# Patient Record
Sex: Male | Born: 1975 | Race: Asian | Hispanic: No | Marital: Single | Smoking: Never smoker
Health system: Southern US, Community
[De-identification: ages and names within clinical notes are randomized; demographics above are authoritative.]

## PROBLEM LIST (undated history)

## (undated) DIAGNOSIS — B181 Chronic viral hepatitis B without delta-agent: Secondary | ICD-10-CM

## (undated) HISTORY — PX: LIVER BIOPSY: SHX301

## (undated) HISTORY — PX: TONSILLECTOMY AND ADENOIDECTOMY: SUR1326

## (undated) HISTORY — DX: Chronic viral hepatitis B without delta-agent: B18.1

---

## 2011-04-11 DIAGNOSIS — J039 Acute tonsillitis, unspecified: Secondary | ICD-10-CM | POA: Insufficient documentation

## 2011-04-11 DIAGNOSIS — K219 Gastro-esophageal reflux disease without esophagitis: Secondary | ICD-10-CM | POA: Insufficient documentation

## 2011-04-18 ENCOUNTER — Encounter: Payer: Self-pay | Admitting: Internal Medicine

## 2011-05-03 ENCOUNTER — Ambulatory Visit: Payer: Self-pay | Admitting: Internal Medicine

## 2011-05-03 ENCOUNTER — Other Ambulatory Visit (INDEPENDENT_AMBULATORY_CARE_PROVIDER_SITE_OTHER): Payer: BC Managed Care – PPO

## 2011-05-03 ENCOUNTER — Ambulatory Visit (INDEPENDENT_AMBULATORY_CARE_PROVIDER_SITE_OTHER): Payer: BC Managed Care – PPO | Admitting: Internal Medicine

## 2011-05-03 ENCOUNTER — Encounter: Payer: Self-pay | Admitting: Internal Medicine

## 2011-05-03 VITALS — BP 102/58 | HR 66 | Ht 67.0 in | Wt 172.0 lb

## 2011-05-03 DIAGNOSIS — B181 Chronic viral hepatitis B without delta-agent: Secondary | ICD-10-CM

## 2011-05-03 DIAGNOSIS — K219 Gastro-esophageal reflux disease without esophagitis: Secondary | ICD-10-CM

## 2011-05-03 LAB — COMPREHENSIVE METABOLIC PANEL
Albumin: 4.3 g/dL (ref 3.5–5.2)
Alkaline Phosphatase: 60 U/L (ref 39–117)
Calcium: 9.2 mg/dL (ref 8.4–10.5)
Chloride: 101 mEq/L (ref 96–112)
Glucose, Bld: 91 mg/dL (ref 70–99)
Potassium: 4.2 mEq/L (ref 3.5–5.1)
Sodium: 138 mEq/L (ref 135–145)
Total Protein: 7.2 g/dL (ref 6.0–8.3)

## 2011-05-03 LAB — CBC WITH DIFFERENTIAL/PLATELET
Eosinophils Relative: 1.7 % (ref 0.0–5.0)
Lymphocytes Relative: 31.2 % (ref 12.0–46.0)
MCV: 84.5 fl (ref 78.0–100.0)
Monocytes Absolute: 0.3 10*3/uL (ref 0.1–1.0)
Monocytes Relative: 7.3 % (ref 3.0–12.0)
Neutrophils Relative %: 59.4 % (ref 43.0–77.0)
Platelets: 185 10*3/uL (ref 150.0–400.0)
WBC: 4.7 10*3/uL (ref 4.5–10.5)

## 2011-05-03 MED ORDER — RANITIDINE HCL 150 MG PO TABS: 150.0000 mg | ORAL_TABLET | Freq: Two times a day (BID) | ORAL | Status: DC

## 2011-05-03 MED ORDER — TENOFOVIR DISOPROXIL FUMARATE 300 MG PO TABS: 300.0000 mg | ORAL_TABLET | Freq: Every day | ORAL | Status: DC

## 2011-05-03 NOTE — Patient Instructions (Addendum)
Your prescriptions are being printed for you to take to your pharmacy ([facility], Zantac). Please call us back with the information of your physician in [place] so we can get the records.  Please go to the basement upon leaving today to have your labs done. You have been scheduled for a Liver ultrasound at [facility] on 05/09/11 @ 8:30 am. Please arrive at the Radiology Department on the 1st floor at 8:15 am. Please have nothing to eat or drink after midnight the night before.

## 2011-05-03 NOTE — Progress Notes (Signed)
  Subjective:    Patient ID: Parker Curry, male    DOB: May 14, 1975, 36 y.o.   MRN: [id number]  HPI this man is here accompanied by his friend and sponsor, he is a Engineer, site. Previously lived in [place] and then a brief time in [place]. He has complaints of heartburn and indigestion and also needs followup and treatment of chronic hepatitis B. It sounds like he developed fatigue and feelings of being unwell about 4 or 5 years ago. 2 years ago he had a liver biopsy and was started on tenofovir. He went back to [place] every 6 months for evaluation with ultrasound and labs and had his medications refilled. He wants to establish care here in the [place] as he will be living long-term in [place] according to the interpreter and friend. His friend and sponsor is a little Stage manager at [employer]. The patient speaks some [place]. His care was in "back, so apparently his records aren't [place] though he does not have those he is given this the name of his hepatologist there we're pursuing records. Dr. Pablo Ledger.  The patient indicates no swelling, no significant liver problems noted in the past other than that we know he has chronic hepatitis B, presumably maternal fetal transmission. Does have fatigue.  He is also complaining of fairly frequent heartburn, with regurgitation but no dysphagia. He does not use caffeine or smoke. He belches frequently when he chance in that is disturbing him. He has used some intermittent ranitidine, one 50 mg, after the fact with some benefit if not complete benefit it sounds like. He's had no dysphagia or weight loss or bleeding.  No Known Allergies No outpatient prescriptions prior to visit.   Past Medical History  Diagnosis Date  . Chronic hepatitis B    Past Surgical History  Procedure Date  . Liver biopsy    History   Social History  . Marital Status: Single    Spouse Name: N/A    Number of Children: 0  . Years of Education: N/A    Occupational History  . [employer]    Social History Main Topics  . Smoking status: Never Smoker   . Smokeless tobacco: Never Used  . Alcohol Use: No  . Drug Use: No  . Sexually Active: None   Other Topics Concern  . None   Social History Narrative  . None   Family History  Problem Relation Age of Onset  . Colon cancer Neg Hx           Review of Systems He's had some sore throat and hoarseness All other systems negative or as above    Objective:   Physical Exam General:  Well-developed, well-nourished Asian man and in no acute distress Eyes:  anicteric. ENT:   Mouth and posterior pharynx free of lesions.  Neck:   supple w/o thyromegaly or mass.  Lungs: Clear to auscultation bilaterally. Heart:  S1S2, no rubs, murmurs, gallops. Abdomen:  soft, non-tender, no hepatosplenomegaly, hernia, or mass and BS+.  Lymph:  no cervical or supraclavicular adenopathy. Extremities:   no edema Skin   no rash. No stigmata of chronic liver disease  Psych:  appropriate mood and  Affect.           Assessment & Plan:

## 2011-05-03 NOTE — Assessment & Plan Note (Signed)
Frequent heartburn and belching, consistent with GERD. No warning signs or symptoms. Will start twice a day ranitidine. 150 mg. Did not see any obvious reason for endoscopy at this point. If he has cirrhosis then could need an upper endoscopy but that has not apparent from the clinical exam and information so far. Await records review.

## 2011-05-03 NOTE — Assessment & Plan Note (Addendum)
I will refill his tenofovir for the time being. We'll check labs to include CBC, metabolic panel that is comprehensive, hepatitis B DNA, and get an alpha-fetoprotein. He will also have an ultrasound of the liver. We need to get his records which are trying to do. And I will search for a hepatologist to try to follow him as I think that will be best. I have explained that is is not a typical part of my practice. The other approach would be to see me and a hepatologist at varying intervals. His friend would prefer he be evaluated and followed at [facility] since she works there.  Fibroscan July 2010 showed minimal fibrosis. Hepatitis B DNA [id number] international units per milliliter October 2011 LFTs have been normal line hepatitis B surface antigen positive, hepatitis B E. antigen positive, hepatitis B E. antibody negative Ultrasound August 2010, unable to interpret at this time, report in [place].

## 2011-05-04 LAB — AFP TUMOR MARKER: AFP-Tumor Marker: 4.5 ng/mL (ref 0.0–8.0)

## 2011-05-06 ENCOUNTER — Telehealth: Payer: Self-pay

## 2011-05-06 NOTE — Telephone Encounter (Signed)
Patient advised of the appt with Dr Wyvonnia Lora at [facility] [phone].  Notes faxed to [place].  The patient is advised of the appt on 06/05/11 10:30.

## 2011-05-06 NOTE — Telephone Encounter (Signed)
Message copied by Annett Fabian on Mon May 06, 2011  4:08 PM ------      Message from: Stan Head E      Created: Mon May 06, 2011  1:26 PM      Regarding: RE: liver patients at [facility]       If they treat chronic hepatitis B that is fine - I think she is an ID doc, however            We have an [place] and quantitative Hep B level pending      ----- Message -----         From: Rossie Muskrat, RN,CGRN         Sent: 05/06/2011  11:13 AM           To: Iva Boop, MD      Subject: RE: liver patients at [facility]                            They do have a hepatologist that treats hep B  Dr Drema Halon.  Do you want me to set this up?      ----- Message -----         From: Iva Boop, MD         Sent: 05/03/2011   1:03 PM           To: Rossie Muskrat, RN,CGRN      Subject: liver patients at [facility]                                This man has Chronic hepatitis B.      Trying to get records from [place].      His friend works at [employer] and wanted him to see hepatologist there if possible. I told her they may not have one. I think they get some help from hepatology docs at [facility] - drive up from [place].            Can you see if they have anyone that does chronic Hepatitis B care and let me know.

## 2011-05-07 ENCOUNTER — Ambulatory Visit: Payer: Self-pay | Admitting: Internal Medicine

## 2011-05-09 ENCOUNTER — Ambulatory Visit (HOSPITAL_COMMUNITY)
Admission: RE | Admit: 2011-05-09 | Discharge: 2011-05-09 | Disposition: A | Discharge status: Home / Self Care | Payer: BC Managed Care – PPO | Source: Ambulatory Visit | Attending: Internal Medicine | Admitting: Internal Medicine

## 2011-05-09 DIAGNOSIS — B181 Chronic viral hepatitis B without delta-agent: Secondary | ICD-10-CM | POA: Insufficient documentation

## 2011-05-10 NOTE — Progress Notes (Signed)
Quick Note:  This shows that the hepatitis B virus is being suppressed by his medication. It will be important for him to keep his appointment at [facility]. Please communicate this to him, the ultrasound looked okay as well. We need to forward these labs and the ultrasound report to the physician at [facility] he is seeing about his hepatitis B. This could take a letter for unable to reach them by phone. ______

## 2011-06-06 ENCOUNTER — Telehealth: Payer: Self-pay | Admitting: Internal Medicine

## 2011-06-06 NOTE — Telephone Encounter (Signed)
Patient wants to schedule a colonoscopy. A friend has called for him and he is not with her to get the exact symptoms.  I have scheduled an office visit for him to discuss in person with Dr Leone Payor on 06/14/11

## 2011-06-06 NOTE — Telephone Encounter (Signed)
Left message for patient to call back  

## 2011-06-14 ENCOUNTER — Encounter: Payer: Self-pay | Admitting: Internal Medicine

## 2011-06-14 ENCOUNTER — Ambulatory Visit (INDEPENDENT_AMBULATORY_CARE_PROVIDER_SITE_OTHER): Payer: BC Managed Care – PPO | Admitting: Internal Medicine

## 2011-06-14 VITALS — BP 90/58 | HR 76 | Ht 67.0 in | Wt 167.2 lb

## 2011-06-14 DIAGNOSIS — R1013 Epigastric pain: Secondary | ICD-10-CM

## 2011-06-14 DIAGNOSIS — R1319 Other dysphagia: Secondary | ICD-10-CM

## 2011-06-14 NOTE — Patient Instructions (Signed)
You have been scheduled for an endoscopy. Please follow written instructions given to you at your visit today.  

## 2011-06-14 NOTE — Progress Notes (Signed)
  Subjective:    Patient ID: Parker Curry, male    DOB: July 04, 1975, 36 y.o.   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 I had seen him last month to establish care for hepatitis B. He is waiting to see an infectious disease specialist at [facility], and has appointment in May. That seems stable overall.  He was complaining of what sounded like heartburn and reflux problems when here the last time. Ranitidine was used, he does not think that's helping. He is also describing food sticking in the suprasternal notch and what seems to be dysphagia. He also has postprandial epigastric pain.  Medications, allergies, past medical history, past surgical history, family history and social history are reviewed and updated in the EMR.   Review of Systems As above    Objective:   Physical Exam General:  NAD Eyes:   anicteric Lungs:  clear Heart:  S1S2 no rubs, murmurs or gallops Abdomen:  soft and nontender, BS+ Ext:   no edema        Assessment & Plan:   1. Dysphagia   2. Epigastric pain    It now sounds like he is having dysphagia problems. There is a bit of a language barrier but between talking to him and clarifying with the interpreter, it does sound like he is having dysphagia. Thus, upper endoscopy is warranted. Possible esophageal dilation. I have explained the risks benefits and indications to the patient and he understands and agrees to proceed. Will wait until this is complete prior to making any medication changes.

## 2011-06-17 ENCOUNTER — Encounter: Payer: Self-pay | Admitting: Internal Medicine

## 2011-06-17 ENCOUNTER — Ambulatory Visit (AMBULATORY_SURGERY_CENTER): Payer: BC Managed Care – PPO | Admitting: Internal Medicine

## 2011-06-17 VITALS — BP 105/77 | HR 76 | Temp 97.3°F | Resp 16 | Ht 67.0 in | Wt 167.0 lb

## 2011-06-17 DIAGNOSIS — R1013 Epigastric pain: Secondary | ICD-10-CM

## 2011-06-17 DIAGNOSIS — R1319 Other dysphagia: Secondary | ICD-10-CM

## 2011-06-17 HISTORY — PX: UPPER GASTROINTESTINAL ENDOSCOPY: SHX188

## 2011-06-17 MED ORDER — PANTOPRAZOLE SODIUM 40 MG PO TBEC: 40.0000 mg | DELAYED_RELEASE_TABLET | Freq: Every day | ORAL | Status: DC

## 2011-06-17 MED ORDER — SODIUM CHLORIDE 0.9 % IV SOLN: 500.0000 mL | INTRAVENOUS | Status: DC

## 2011-06-17 NOTE — Progress Notes (Signed)
Interpretor in with pt, Parker Curry,  And he went over the instructions with the pt.  Also the pt care partner was [place] but also spoke vietnanise.  No complaints noted in the recovery room.  Patient did not experience any of the following events: a burn prior to discharge; a fall within the facility; wrong site/side/patient/procedure/implant event; or a hospital transfer or hospital admission upon discharge from the facility. [phone]) Patient did not have preoperative order for IV antibiotic SSI prophylaxis. [phone])

## 2011-06-17 NOTE — Patient Instructions (Addendum)
The endoscopy exam looked normal. The esophagus was dilated given your swallowing problems. Stop ranitidine and start pantoprazole 40 mg daily. This blocks acid and hopefully will help with your abdominal pain and reflux problems.  A prescription was sent to your pharmacy.  Follow the dilatation diet the rest of the day.  Resume your prior diet Tuesday am.   YOU HAD AN ENDOSCOPIC PROCEDURE TODAY AT THE [facility]: Refer to the procedure report that was given to you for any specific questions about what was found during the examination.  If the procedure report does not answer your questions, please call your gastroenterologist to clarify.  If you requested that your care partner not be given the details of your procedure findings, then the procedure report has been included in a sealed envelope for you to review at your convenience later.  YOU SHOULD EXPECT: Some feelings of bloating in the abdomen. Passage of more gas than usual.  Walking can help get rid of the air that was put into your GI tract during the procedure and reduce the bloating. If you had a lower endoscopy (such as a colonoscopy or flexible sigmoidoscopy) you may notice spotting of blood in your stool or on the toilet paper. If you underwent a bowel prep for your procedure, then you may not have a normal bowel movement for a few days.  DIET: Your first meal following the procedure should be a light meal and then it is ok to progress to your normal diet.  A half-sandwich or bowl of soup is an example of a good first meal.  Heavy or fried foods are harder to digest and may make you feel nauseous or bloated.  Likewise meals heavy in dairy and vegetables can cause extra gas to form and this can also increase the bloating.  Drink plenty of fluids but you should avoid alcoholic beverages for 24 hours.  ACTIVITY: Your care partner should take you home directly after the procedure.  You should plan to take it easy, moving slowly for  the rest of the day.  You can resume normal activity the day after the procedure however you should NOT DRIVE or use heavy machinery for 24 hours (because of the sedation medicines used during the test).    SYMPTOMS TO REPORT IMMEDIATELY: A gastroenterologist can be reached at any hour.  During normal business hours, 8:30 AM to 5:00 PM Monday through Friday, call [phone].  After hours and on weekends, please call the GI answering service at [phone] who will take a message and have the physician on call contact you.     Following upper endoscopy (EGD)  Vomiting of blood or coffee ground material  New chest pain or pain under the shoulder blades  Painful or persistently difficult swallowing  New shortness of breath  Fever of 100F or higher  Black, tarry-looking stools  FOLLOW UP: If any biopsies were taken you will be contacted by phone or by letter within the next 1-3 weeks.  Call your gastroenterologist if you have not heard about the biopsies in 3 weeks.  Our staff will call the home number listed on your records the next business day following your procedure to check on you and address any questions or concerns that you may have at that time regarding the information given to you following your procedure. This is a courtesy call and so if there is no answer at the home number and we have not heard from you through  the emergency physician on call, we will assume that you have returned to your regular daily activities without incident.  SIGNATURES/CONFIDENTIALITY: You and/or your care partner have signed paperwork which will be entered into your electronic medical record.  These signatures attest to the fact that that the information above on your After Visit Summary has been reviewed and is understood.  Full responsibility of the confidentiality of this discharge information lies with you and/or your care-partner.

## 2011-06-17 NOTE — Op Note (Signed)
[facility] [address] [place], [place]  [address]  ENDOSCOPY PROCEDURE REPORT  PATIENT:  Parker, Curry  MR#:  [id number] BIRTHDATE:  02/28/1976, 35 yrs. old  GENDER:  male  ENDOSCOPIST:  Iva Boop, MD, [facility]  PROCEDURE DATE:  06/17/2011 PROCEDURE:  EGD, diagnostic, Elease Hashimoto Dilation of the Esophagus ASA CLASS:  Class I INDICATIONS:  1) dysphagia  MEDICATIONS:   These medications were titrated to patient response per physician's verbal order, Fentanyl 50 mcg IV, Versed 5 mg IV TOPICAL ANESTHETIC:  Cetacaine Spray  DESCRIPTION OF PROCEDURE:   After the risks benefits and alternatives of the procedure were thoroughly explained, informed consent was obtained.  The [id number] endoscope was introduced through the mouth and advanced to the second portion of the duodenum, without limitations.  The instrument was slowly withdrawn as the mucosa was carefully examined. <<PROCEDUREIMAGES>>  The upper, middle, and distal third of the esophagus were carefully inspected and no abnormalities were noted. The z-line was well seen at the GEJ. The endoscope was pushed into the fundus which was normal including a retroflexed view. The antrum, first and second part of the duodenum were unremarkable.    Dilation was then performed at the total esophagus  1) Dilator:  Elease Hashimoto  Size(s):  54 French Resistance:  minimal  Heme:  none  COMPLICATIONS:  None  ENDOSCOPIC IMPRESSION: 1) Normal EGD - 54 Fr Maloney dilation performed due to dysphagia RECOMMENDATIONS: 1) post dilation diet today 2) stop ranitidine and start pantoprazole 40 mg daily (prescription sent electronically) 3) Follow-up with GI as needed and annually on routine basis  Iva Boop, MD, [facility]  CC:  The Patient  n. eSIGNED:   Iva Boop at 06/17/2011 03:10 PM  Eulis Foster, [phone]

## 2011-06-18 ENCOUNTER — Telehealth: Payer: Self-pay | Admitting: *Deleted

## 2011-06-18 NOTE — Telephone Encounter (Signed)
NO ANSWER, MESSAGE LEFT FOR PATIENT ON ANSWERING MACHINE. 

## 2011-11-08 ENCOUNTER — Telehealth: Payer: Self-pay | Admitting: Internal Medicine

## 2011-11-08 NOTE — Telephone Encounter (Signed)
Forward  4 pages from [facility] to Dr. Stan Head for review on 11-11-11 ym

## 2011-11-11 ENCOUNTER — Telehealth: Payer: Self-pay | Admitting: Internal Medicine

## 2011-11-11 NOTE — Telephone Encounter (Signed)
Received 4 pages from [facility]. Sent to Dr. Leone Payor. 11/11/11/SD

## 2012-02-25 ENCOUNTER — Encounter: Payer: Self-pay | Admitting: Cardiovascular Disease

## 2012-02-25 ENCOUNTER — Ambulatory Visit (INDEPENDENT_AMBULATORY_CARE_PROVIDER_SITE_OTHER): Payer: BC Managed Care – PPO | Admitting: Cardiovascular Disease

## 2012-02-25 VITALS — BP 106/78 | HR 82 | Ht 65.5 in | Wt 176.4 lb

## 2012-02-25 DIAGNOSIS — R0789 Other chest pain: Secondary | ICD-10-CM

## 2012-02-25 NOTE — Assessment & Plan Note (Signed)
Boaz presents today for further evaluation of his chest pain. The pain is described as a pressure like sensation. It typically occur if he has eaten some or feces exercising. The pain last about 20 minutes. It is described as a heaviness or pressure. He typically stops and rests and stretches out as just to get the pain to go away.  He does not eat a lot of greasy food. He has history of gastroesophageal reflux but has been on an acid blocker which is helped some. These pains do not appear to be the same type of pain

## 2012-02-25 NOTE — Patient Instructions (Addendum)
Interpreter needed, speaks viet[place].   Your physician has requested that you have a stress echocardiogram.  Please follow instruction sheet as given.  Your physician recommends that you schedule a follow-up appointment in: as needed basis

## 2012-02-25 NOTE — Progress Notes (Signed)
    Parker Curry Date of Birth  1976-02-15       [facility]    [place] [address]  [address] [place], [place]  [address]   [place], [place]  [address] [phone]     [phone]   Fax  [phone]    Fax [phone]  Problem List: 1. Chest pain   History of Present Illness:  Parker Curry is a 36 yo [place] gentleman who presents for evaluation of chest pain.  Pressure, heaviness sensation.  Lasts for 20 minutes. It is relieved with stretching.    He exercises on a regular basis.  He eats typical [place] food. He is a vegetarian.  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  A [place] interpreter from the  hospital was here to help with the office visit today.  Current Outpatient Prescriptions on File Prior to Visit  Medication Sig Dispense Refill  . pantoprazole (PROTONIX) 40 MG tablet Take 1 tablet (40 mg total) by mouth daily. 30 minutes before breakfast  30 tablet  11  . tenofovir (VIREAD) 300 MG tablet Take 1 tablet (300 mg total) by mouth daily.  30 tablet  1  . [DISCONTINUED] ranitidine (ZANTAC) 150 MG tablet Take 1 tablet (150 mg total) by mouth 2 (two) times daily.  60 tablet  1    No Known Allergies  Past Medical History  Diagnosis Date  . Chronic hepatitis B     Past Surgical History  Procedure Date  . Liver biopsy   . Tonsillectomy and adenoidectomy     History  Smoking status  . Never Smoker   Smokeless tobacco  . Never Used    History  Alcohol Use No    Family History  Problem Relation Age of Onset  . Colon cancer Neg Hx     Reviw of Systems:  Reviewed in the HPI.  All other systems are negative.  Physical Exam: Blood pressure 106/78, pulse 82, height 5' 5.5" (1.664 m), weight 176 lb 6.4 oz (80.015 kg). General: Well developed, well nourished Asian gentleman in  no acute distress.  Head: Normocephalic, atraumatic, sclera non-icteric, mucus membranes are moist,   Neck: Supple.  Carotids are 2 + without bruits. No JVD   Lungs: Clear   Heart: RR, normal S1, S2  Abdomen: Soft, non-tender, non-distended with normal bowel sounds.  Msk:  Strength and tone are normal   Extremities: No clubbing or cyanosis. No edema.  Distal pedal pulses are 2+ and equal    Neuro: CN II - XII intact.  Alert and oriented X 3.   Psych:  Normal   ECG: Dec. 3,  2013-normal sinus rhythm at 82 beats a minute. He has early repolarization changes.  Assessment / Plan:

## 2012-03-02 ENCOUNTER — Ambulatory Visit (HOSPITAL_COMMUNITY): Payer: BC Managed Care – PPO | Attending: Internal Medicine | Admitting: Radiology

## 2012-03-02 ENCOUNTER — Ambulatory Visit (HOSPITAL_COMMUNITY): Payer: BC Managed Care – PPO | Attending: Internal Medicine

## 2012-03-02 DIAGNOSIS — R0789 Other chest pain: Secondary | ICD-10-CM | POA: Insufficient documentation

## 2012-03-02 DIAGNOSIS — R0989 Other specified symptoms and signs involving the circulatory and respiratory systems: Secondary | ICD-10-CM

## 2012-03-02 DIAGNOSIS — R072 Precordial pain: Secondary | ICD-10-CM

## 2012-03-02 NOTE — Progress Notes (Signed)
Echocardiogram performed.  

## 2012-03-06 ENCOUNTER — Encounter: Payer: Self-pay | Admitting: *Deleted

## 2012-06-16 IMAGING — US US ABDOMEN COMPLETE
1 series · 14 of 25 positions shown · non-contrast
Comparison: None.

CLINICAL DATA: Hepatitis B screening.

COMPLETE ABDOMINAL ULTRASOUND

[Series 1: us abdomen complete · 0.32mm/px · 14 of 73 slices shown]
[im 1/73]
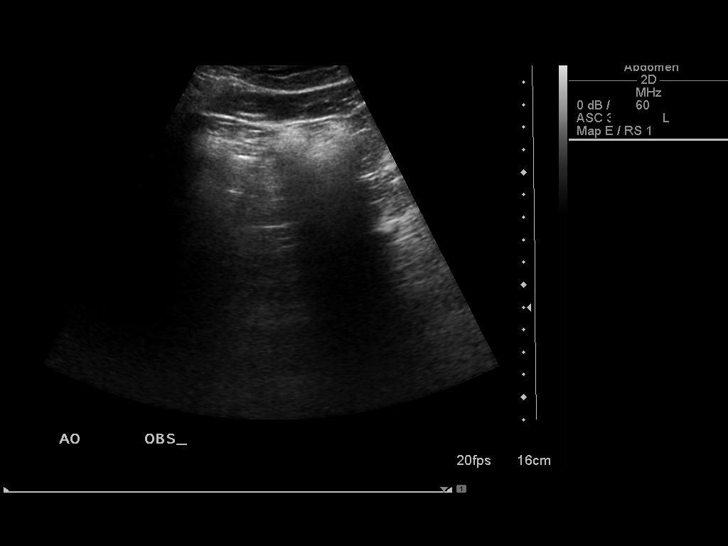
[im 7/73]
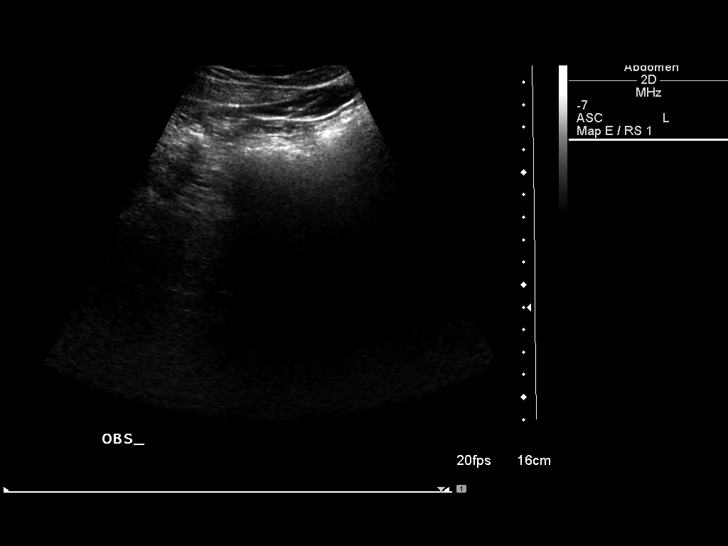
[im 13/73]
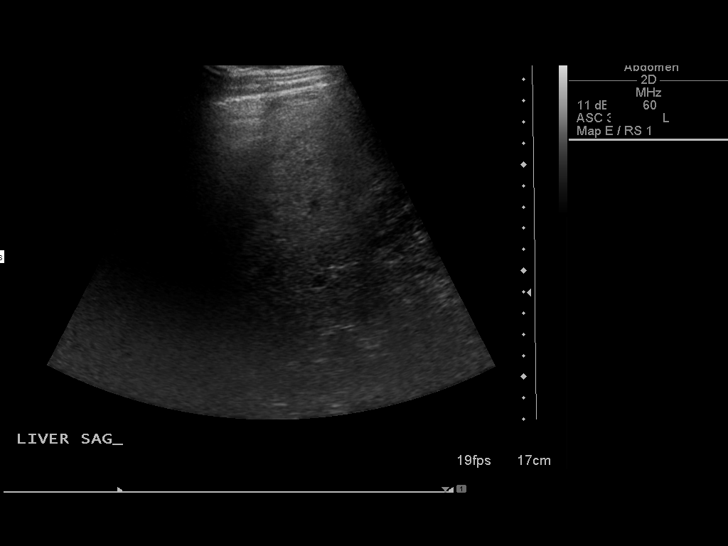
[im 19/73]
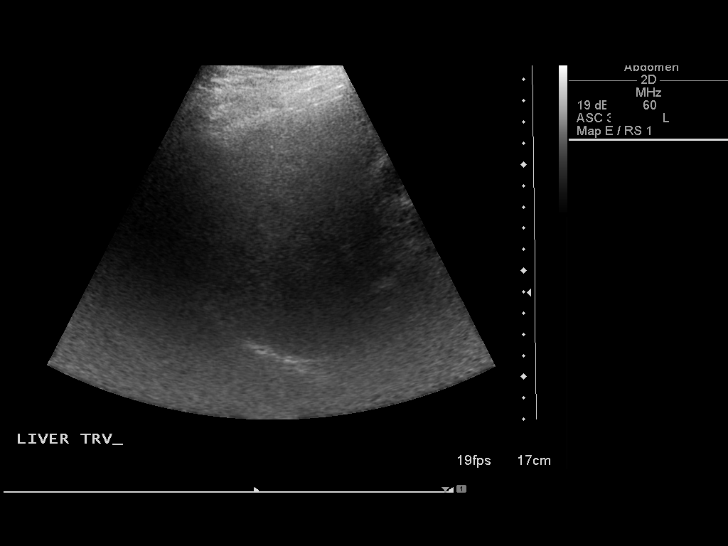
[im 25/73]
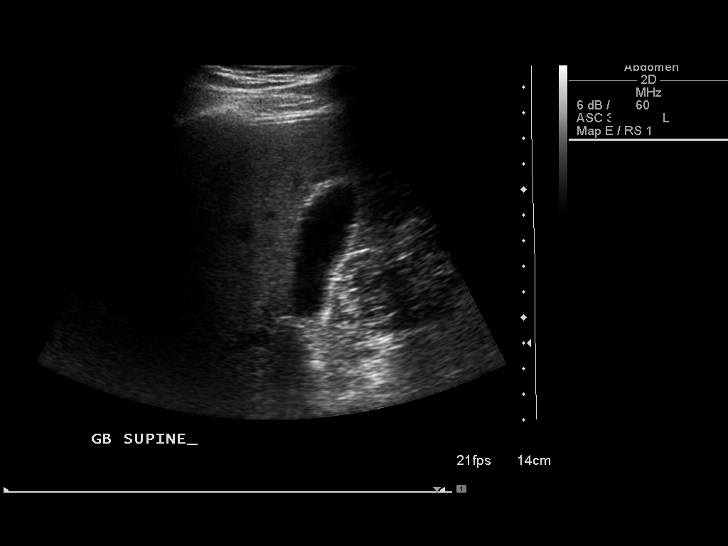
[im 28/73]
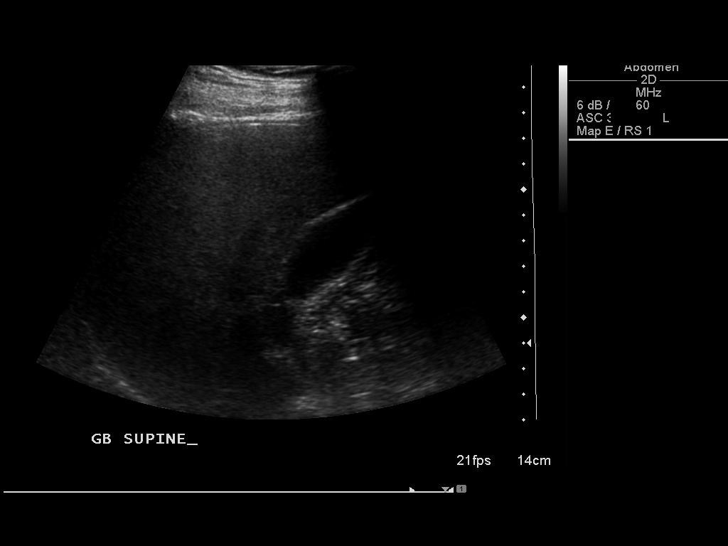
[im 34/73]
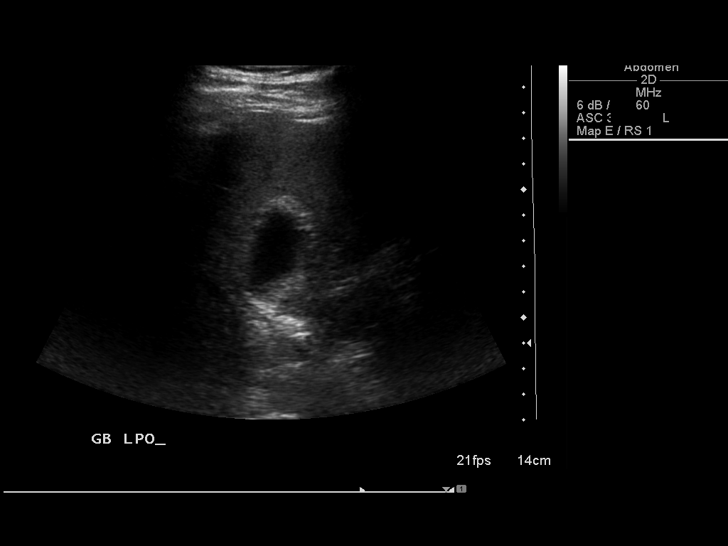
[im 40/73]
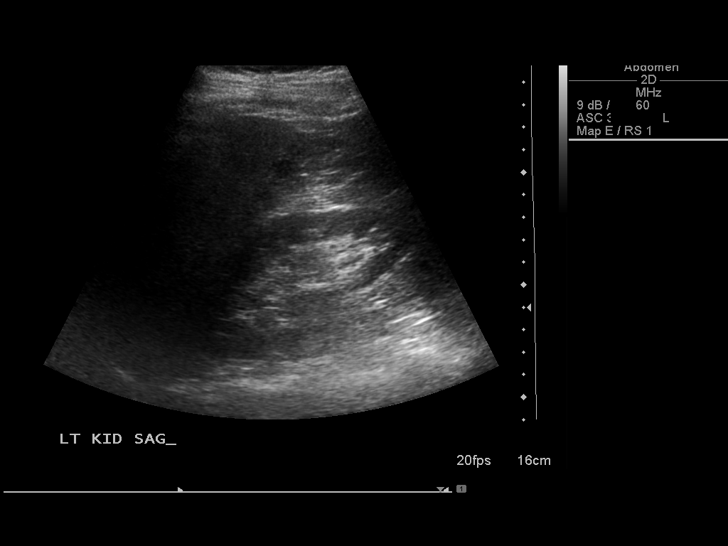
[im 46/73]
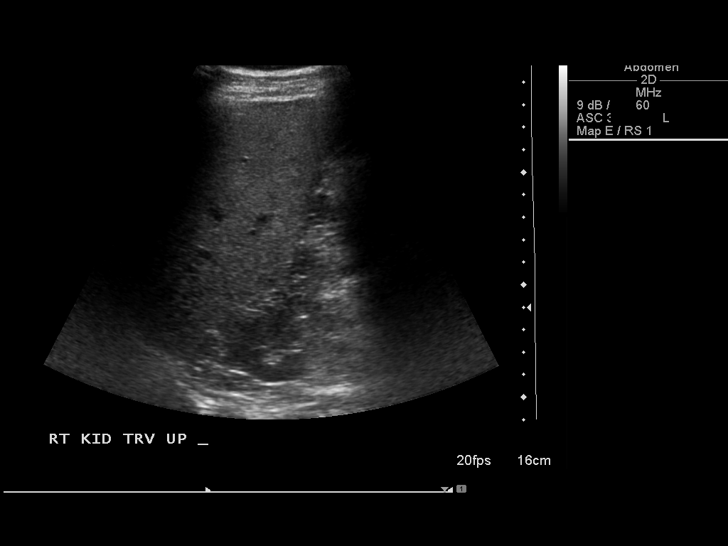
[im 49/73]
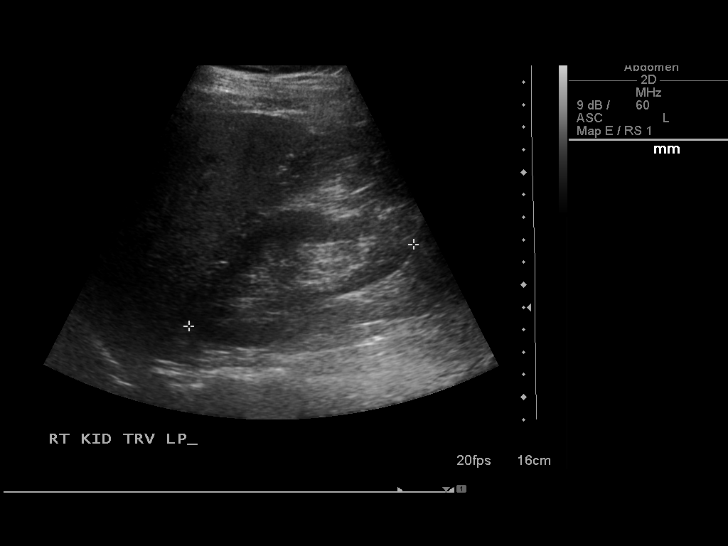
[im 55/73]
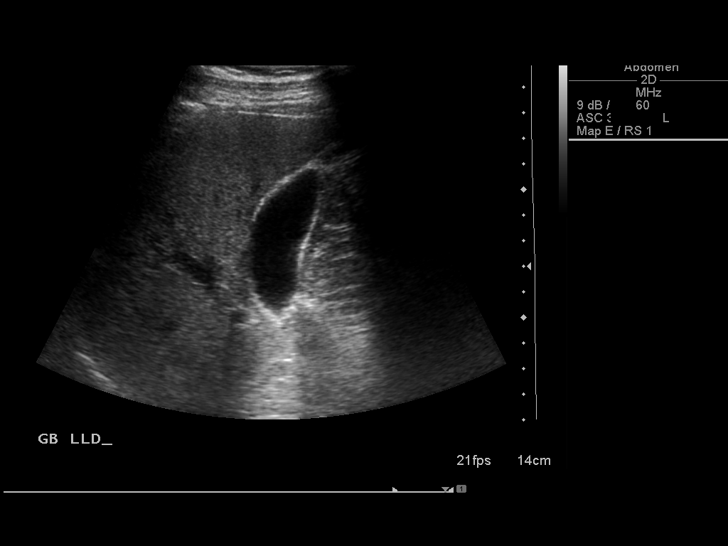
[im 61/73]
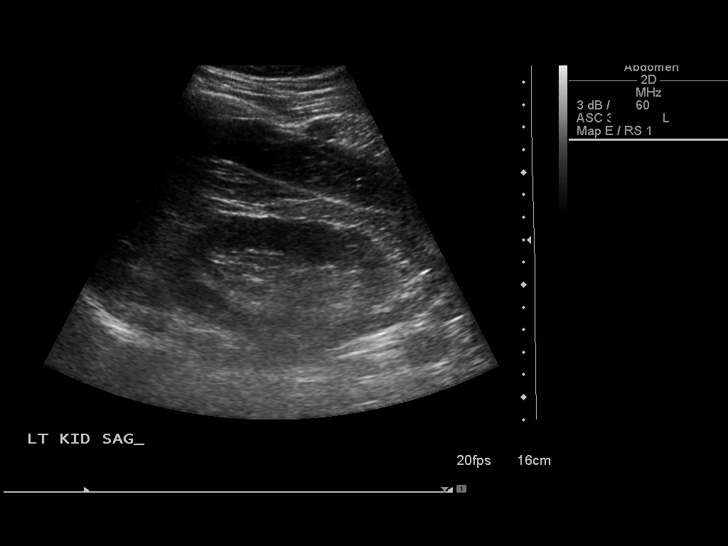
[im 67/73]
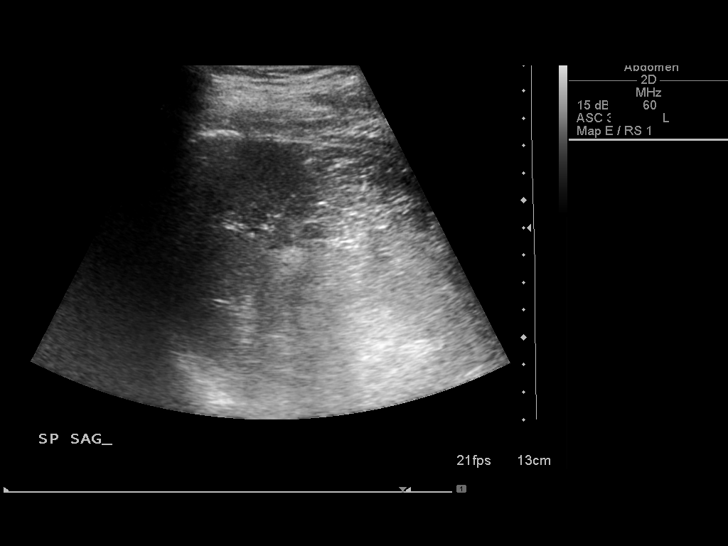
[im 73/73]
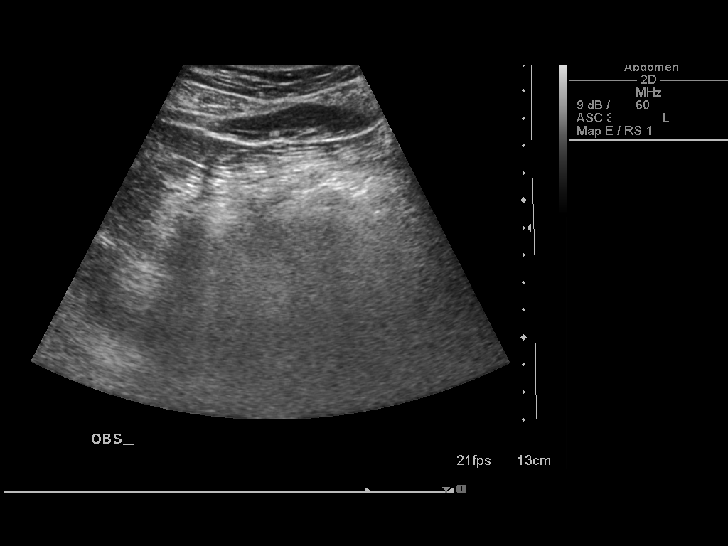

[14 of 25 positions shown; findings below may reference images not displayed]

FINDINGS: Gallbladder:  No gallstones, gallbladder wall thickening, or
pericholecystic fluid.

Common bile duct:  4.2 mm.

Liver:  Evaluation limited by bowel gas.  Visualized portions
reveal increased echogenicity without obvious mass

IVC:  Not visualized secondary to overlying bowel gas.

Pancreas:  Not visualized secondary to overlying bowel gas.

Spleen:  5.5 cm without focal mass.

Right Kidney:  11.4 cm. No hydronephrosis or renal mass..

Left Kidney:  11.5 cm. No hydronephrosis or renal mass..

Abdominal aorta:  Proximal aspect not visualized secondary to bowel
gas.  Otherwise maximal AP dimension 2 cm.
IMPRESSION: Examination limited by bowel gas.  No definitive liver mass
identified.  Please see above.

## 2013-04-09 ENCOUNTER — Ambulatory Visit: Payer: BC Managed Care – PPO | Admitting: Internal Medicine

## 2013-05-25 ENCOUNTER — Ambulatory Visit (INDEPENDENT_AMBULATORY_CARE_PROVIDER_SITE_OTHER): Payer: BC Managed Care – PPO | Admitting: Family Medicine

## 2013-05-25 VITALS — BP 96/74 | HR 80 | Temp 97.6°F | Resp 16 | Ht 65.5 in | Wt 169.0 lb

## 2013-05-25 DIAGNOSIS — K219 Gastro-esophageal reflux disease without esophagitis: Secondary | ICD-10-CM

## 2013-05-25 DIAGNOSIS — J069 Acute upper respiratory infection, unspecified: Secondary | ICD-10-CM

## 2013-05-25 MED ORDER — RANITIDINE HCL 150 MG PO TABS: 150.0000 mg | ORAL_TABLET | Freq: Two times a day (BID) | ORAL | Status: DC

## 2013-05-25 MED ORDER — AZITHROMYCIN 250 MG PO TABS: ORAL_TABLET | ORAL | Status: DC

## 2013-05-25 NOTE — Progress Notes (Signed)
This chart was scribed for Parker SidleKurt Lauenstein, MD  by Ashley JacobsBrittany Andrews, [facility] Scribe. The patient was seen in room and the patient's care was started at 11:32 AM.  Sore Throat   Fever  Associated symptoms include a sore throat.  HPI Comments:Parker Curry is a 38 y.o. male who arrives to the [facility] complaining of unresolved sore throat for the past three to four days. Pt has the associated symptom of heart burn and fever. 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 Pt is unmarried.   Past Medical History  Diagnosis Date   Chronic hepatitis B    Current Outpatient Prescriptions on File Prior to Visit  Medication Sig Dispense Refill   tenofovir (VIREAD) 300 MG tablet Take 1 tablet (300 mg total) by mouth daily.  30 tablet  1   No current facility-administered medications on file prior to visit.   No Known Allergies Family History  Problem Relation Age of Onset   Colon cancer Neg Hx    Review of Systems  Constitutional: Positive for fever.  HENT: Positive for sore throat.    Objective: Very pleasant individual in no acute distress, and moderately overweight HEENT: Mild erythema of the throat, no exudates Neck: Supple no adenopathy Chest: Clear Heart: Regular no murmur: Abdomen: Soft nontender Skin: Clear rashes or suspicious lesions Neurologically: Patient alert and cooperative, normal gait  Assessment: URI  Plan: Z-Pak

## 2013-06-10 ENCOUNTER — Encounter: Payer: Self-pay | Admitting: Internal Medicine

## 2013-06-10 ENCOUNTER — Ambulatory Visit (INDEPENDENT_AMBULATORY_CARE_PROVIDER_SITE_OTHER): Payer: BC Managed Care – PPO | Admitting: Internal Medicine

## 2013-06-10 VITALS — BP 90/68 | HR 64 | Ht 65.75 in | Wt 171.4 lb

## 2013-06-10 DIAGNOSIS — R1013 Epigastric pain: Secondary | ICD-10-CM

## 2013-06-10 DIAGNOSIS — B181 Chronic viral hepatitis B without delta-agent: Secondary | ICD-10-CM

## 2013-06-10 DIAGNOSIS — R12 Heartburn: Secondary | ICD-10-CM

## 2013-06-10 DIAGNOSIS — L509 Urticaria, unspecified: Secondary | ICD-10-CM

## 2013-06-10 DIAGNOSIS — K3189 Other diseases of stomach and duodenum: Secondary | ICD-10-CM

## 2013-06-10 MED ORDER — PANTOPRAZOLE SODIUM 40 MG PO TBEC: 40.0000 mg | DELAYED_RELEASE_TABLET | Freq: Every day | ORAL | Status: DC

## 2013-06-10 NOTE — Progress Notes (Signed)
         Subjective:    Patient ID: Parker Curry, male    DOB: March 02, 1976, 38 y.o.   MRN: [id number]  HPI The patient is a middle aged [place] man known to me with chronic HBV ([facility]) and hx of dysphagia and dyspepsia sxs, s/p EGD and dilation. He has recently had  hives - ?  From Nexium but persist after he stopped that.  Heartburn, epigastric discomfort are chronic recurrent sxs and he also saw cardiology for chest pressure with negative fx study.  Seems anxious about liver, HBV. Due to see Dr. Wyvonnia LoraNunez in May [facility]  No Known Allergies Outpatient Prescriptions Prior to Visit  Medication Sig Dispense Refill  . tenofovir (VIREAD) 300 MG tablet Take 1 tablet (300 mg total) by mouth daily.  30 tablet  1  . azithromycin (ZITHROMAX Z-PAK) 250 MG tablet Take as directed on pack  6 tablet  0  . ranitidine (ZANTAC) 150 MG tablet Take 1 tablet (150 mg total) by mouth 2 (two) times daily.  60 tablet  1   No facility-administered medications prior to visit.   Past Medical History  Diagnosis Date  . Chronic hepatitis B    Past Surgical History  Procedure Laterality Date  . Liver biopsy    . Tonsillectomy and adenoidectomy    EGD       Review of Systems As above Wt Readings from Last 3 Encounters:  06/10/13 171 lb 6 oz (77.735 kg)  05/25/13 169 lb (76.658 kg)  02/25/12 176 lb 6.4 oz (80.015 kg)       Objective:   Physical Exam WDWN NAD Abd soft but muscular and nontender w/o HSM     Assessment & Plan:  Chronic hepatitis B - Plan: US Abdomen Complete, CANCELED: US Abdomen Limited  Heartburn  Dyspepsia  Hives   1) US abdomen, screen HCC and investigate dyspepsia 2) pantoprazole 40 mg daily 3) reassure 4) 2 month f/u with interpreter 5) See Dr.  Wyvonnia LoraNunez as planned re: HBV (May) advised him to call to schedule  Cc: Dr. Wyvonnia LoraNunez

## 2013-06-10 NOTE — Patient Instructions (Addendum)
Please call [facility]. [facility] at [facility] for your follow up appointment.  Go to urgent care or your PCP to get your hives checked on.  We have sent the following medications to your pharmacy for you to pick up at your convenience: Pantoprazole  Come back and see us in 2 months.  You have been scheduled for an abdominal [facility] (1st floor of hospital) on 06/11/13 at 9:00am. Please arrive 15 minutes prior to your appointment for registration. Make certain not to have anything to eat or drink 6 hours prior to your appointment. Should you need to reschedule your appointment, please contact radiology at [phone]. This test typically takes about 30 minutes to perform.   I appreciate the opportunity to care for you.

## 2013-06-11 ENCOUNTER — Ambulatory Visit (HOSPITAL_COMMUNITY)
Admission: RE | Admit: 2013-06-11 | Discharge: 2013-06-11 | Disposition: A | Discharge status: Home / Self Care | Payer: BC Managed Care – PPO | Source: Ambulatory Visit | Attending: Internal Medicine | Admitting: Internal Medicine

## 2013-06-11 DIAGNOSIS — B181 Chronic viral hepatitis B without delta-agent: Secondary | ICD-10-CM | POA: Insufficient documentation

## 2013-06-11 NOTE — Progress Notes (Signed)
Quick Note:  No gallstones, no cause of his abdominal pain seen on US ______

## 2013-06-12 ENCOUNTER — Encounter: Payer: Self-pay | Admitting: Internal Medicine

## 2013-06-14 ENCOUNTER — Encounter: Payer: Self-pay | Admitting: Physician Assistant

## 2013-06-14 ENCOUNTER — Ambulatory Visit (INDEPENDENT_AMBULATORY_CARE_PROVIDER_SITE_OTHER): Payer: BC Managed Care – PPO | Admitting: Physician Assistant

## 2013-06-14 VITALS — BP 114/80 | HR 71 | Temp 98.1°F | Ht 65.5 in | Wt 170.4 lb

## 2013-06-14 DIAGNOSIS — K219 Gastro-esophageal reflux disease without esophagitis: Secondary | ICD-10-CM

## 2013-06-14 DIAGNOSIS — Z23 Encounter for immunization: Secondary | ICD-10-CM

## 2013-06-14 DIAGNOSIS — B1910 Unspecified viral hepatitis B without hepatic coma: Secondary | ICD-10-CM

## 2013-06-14 DIAGNOSIS — Z0000 Encounter for general adult medical examination without abnormal findings: Secondary | ICD-10-CM

## 2013-06-14 MED ORDER — TETANUS-DIPHTH-ACELL PERTUSSIS 5-2.5-18.5 LF-MCG/0.5 IM SUSP: 0.5000 mL | Freq: Once | INTRAMUSCULAR | Status: AC

## 2013-06-14 MED ORDER — TENOFOVIR DISOPROXIL FUMARATE 300 MG PO TABS
300.0000 mg | ORAL_TABLET | Freq: Every day | ORAL | Status: DC
Start: 2013-06-14 — End: 2015-07-12

## 2013-06-14 NOTE — Progress Notes (Signed)
Pre visit review using our clinic review tool, if applicable. No additional management support is needed unless otherwise documented below in the visit note. 

## 2013-06-14 NOTE — Patient Instructions (Signed)
It was great meeting you today Mr. Parker Curry!   Labs have been ordered for you, when you report to lab please be fasting.    Continue with your current medications as prescribed.   Diet for Gastroesophageal Reflux Disease, Adult Reflux is when stomach acid flows up into the esophagus. The esophagus becomes irritated and sore (inflammation). When reflux happens often and is severe, it is called gastroesophageal reflux disease (GERD). What you eat can help ease any discomfort caused by GERD. FOODS OR DRINKS TO AVOID OR LIMIT  Coffee and black tea, with or without caffeine.  Bubbly (carbonated) drinks with caffeine or energy drinks.  Strong spices, such as pepper, cayenne pepper, curry, or chili powder.  Peppermint or spearmint.  Chocolate.  High-fat foods, such as meats, fried food, oils, butter, or nuts.  Fruits and vegetables that cause discomfort. This includes citrus fruits and tomatoes.  Alcohol. If a certain food or drink irritates your GERD, avoid eating or drinking it. THINGS THAT MAY HELP GERD INCLUDE:  Eat meals slowly.  Eat 5 to 6 small meals a day, not 3 large meals.  Do not eat food for a certain amount of time if it causes discomfort.  Wait 3 hours after eating before lying down.  Keep the head of your bed raised 6 to 9 inches (15 23 centimeters). Put a foam wedge or blocks under the legs of the bed.  Stay active. Weight loss, if needed, may help ease your discomfort.  Wear loose-fitting clothing.  Do not smoke or chew tobacco. Document Released: 09/10/2011 Document Reviewed: 09/10/2011 [facility] Patient Information 2014 [place] [place].

## 2013-06-14 NOTE — Progress Notes (Signed)
Patient ID: Parker Curry is a 38 y.o. male DOB: [phone] MRN: [id number]     HPI:  Patient is a 38 year old male here to establish care. Has not had a PCP in some time. Reports he is from [place], recently lived in [place]. Patient is a Engineer, site. Patient is a poor historian, patient is not proficient in the [place] language. Reports history of Hepatitis B and GERD, follows with Dr. Leone Payor, gastroenterology. Ultrasound of abdomen within last couple days. Reports no other health concerns at this time. Is not certain of family medical history. Is uncertain of previous vaccines. Is a non smoker and does not drink. Denies chest pain/palpitations, extremity swelling, abdominal pain/cramping or distention. Denies N/V/F, visual change/disturbances, SOB, cough, lightheaded, dizzy or weakness.    Influenza: no Tetanus: unknown Eye Dr. no Dentist no Colonoscopy: no  ROS: As stated in HPI. All other systems negative  Past Medical History  Diagnosis Date  . Chronic hepatitis B    Family History  Problem Relation Age of Onset  . Colon cancer Neg Hx    History   Social History  . Marital Status: Single    Spouse Name: N/A    Number of Children: 0  . Years of Education: N/A   Occupational History  . [employer]    Social History Main Topics  . Smoking status: Never Smoker   . Smokeless tobacco: Never Used  . Alcohol Use: No  . Drug Use: No  . Sexual Activity: None   Other Topics Concern  . 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   His friends and supporter who is a Consulting civil engineer has a clinical lab signed just at [facility].   Past Surgical History  Procedure Laterality Date  . Liver biopsy    . Tonsillectomy and adenoidectomy    . Upper gastrointestinal endoscopy      and Maloney dilation (no stricture)   Current Outpatient Prescriptions on File Prior to Visit  Medication Sig Dispense Refill  . pantoprazole (PROTONIX)  40 MG tablet Take 1 tablet (40 mg total) by mouth daily before breakfast.  30 tablet  11   No current facility-administered medications on file prior to visit.   No Known Allergies  PE:  Filed Vitals:   06/14/13 1053  BP: 114/80  Pulse: 71  Temp: 98.1 F (36.7 C)    CONSTITUTIONAL: Well developed, well nourished, pleasant, appears stated age, in NAD HEENT: normocephalic, atraumatic, bilateral ext/int canals normal. Bilateral TM's without injections, bulging, erythema. Nose normal, uvula midline, oropharynx clear and moist. EYES: PERRLA, bilateral EOM and conjunctiva normal, no icterus noted. NECK: FROM, supple, without thyromegaly or mass CARDIO: RRR, normal S1 and S2, distal pulses intact. PULM/CHEST CTA bilateral, no wheezes, rales or rhonchi. Non tender. ABD: appearance normal, soft, nontender. Normal bowel sounds x 4 quadrants, no HSM, no distention. GU: deferred.  MUSC: FROM U/Wolfman bilateral, FROM thoracic and lumbar spine, no midline tenderness.  LYMPH: no cervical, supraclavicular adenopathy NEURO: alert and oriented x 3, no cranial nerve deficit, motor strength and coordination NL. DTR's intact. Gait normal. SKIN: warm, dry, no rash or lesions noted. PSYCH: Mood and affect normal, speech normal.   Lab Results  Component Value Date   WBC 4.7 05/03/2011   HGB 15.3 05/03/2011   HCT 44.4 05/03/2011   PLT 185.0 05/03/2011   GLUCOSE 91 05/03/2011   ALT 41 05/03/2011   AST  32 05/03/2011   NA 138 05/03/2011   K 4.2 05/03/2011   CL 101 05/03/2011   CREATININE 1.1 05/03/2011   BUN 13 05/03/2011   CO2 32 05/03/2011     ASSESSMENT and PLAN   CPX/v70.0 - Patient has been counseled on age-appropriate routine health concerns for screening and prevention. These are reviewed and up-to-date. Immunizations are up-to-date or declined. Labs ordered and will be reviewed.  HM Tdap updated today  Hepatitis B, Chronic Continue with Viread 300 mg daily Keep scheduled appointments with  GI  GERD Continue with Pantoprazole 40 mg daily

## 2013-06-16 ENCOUNTER — Other Ambulatory Visit (INDEPENDENT_AMBULATORY_CARE_PROVIDER_SITE_OTHER): Payer: BC Managed Care – PPO

## 2013-06-16 DIAGNOSIS — Z0000 Encounter for general adult medical examination without abnormal findings: Secondary | ICD-10-CM

## 2013-06-16 LAB — CBC WITH DIFFERENTIAL/PLATELET
Basophils Absolute: 0 10*3/uL (ref 0.0–0.1)
Basophils Relative: 0.2 % (ref 0.0–3.0)
Eosinophils Absolute: 0.1 10*3/uL (ref 0.0–0.7)
Eosinophils Relative: 0.8 % (ref 0.0–5.0)
HEMATOCRIT: 45.5 % (ref 39.0–52.0)
Hemoglobin: 15.2 g/dL (ref 13.0–17.0)
LYMPHS ABS: 1.6 10*3/uL (ref 0.7–4.0)
LYMPHS PCT: 19.9 % (ref 12.0–46.0)
MCHC: 33.4 g/dL (ref 30.0–36.0)
MCV: 83.5 fl (ref 78.0–100.0)
Monocytes Absolute: 0.4 10*3/uL (ref 0.1–1.0)
Monocytes Relative: 5.3 % (ref 3.0–12.0)
Neutro Abs: 5.8 10*3/uL (ref 1.4–7.7)
Neutrophils Relative %: 73.8 % (ref 43.0–77.0)
Platelets: 180 10*3/uL (ref 150.0–400.0)
RBC: 5.44 Mil/uL (ref 4.22–5.81)
RDW: 14.4 % (ref 11.5–14.6)
WBC: 7.9 10*3/uL (ref 4.5–10.5)

## 2013-06-16 LAB — URINALYSIS, ROUTINE W REFLEX MICROSCOPIC
BILIRUBIN URINE: NEGATIVE
HGB URINE DIPSTICK: NEGATIVE
Ketones, ur: NEGATIVE
LEUKOCYTES UA: NEGATIVE
NITRITE: NEGATIVE
Specific Gravity, Urine: >=1.03 — AB (ref 1.000–1.030)
Total Protein, Urine: NEGATIVE
Urine Glucose: NEGATIVE
Urobilinogen, UA: 0.2 (ref 0.0–1.0)
pH: 5.5 (ref 5.0–8.0)

## 2013-06-16 LAB — LIPID PANEL
CHOLESTEROL: 189 mg/dL (ref 0–200)
HDL: 32.8 mg/dL — ABNORMAL LOW (ref >39.00)
LDL Cholesterol: 124 mg/dL — ABNORMAL HIGH (ref 0–99)
TRIGLYCERIDES: 162 mg/dL — AB (ref 0.0–149.0)
Total CHOL/HDL Ratio: 6
VLDL: 32.4 mg/dL (ref 0.0–40.0)

## 2013-07-30 ENCOUNTER — Encounter: Payer: Self-pay | Admitting: Gastroenterology

## 2013-08-11 ENCOUNTER — Ambulatory Visit: Payer: BC Managed Care – PPO | Admitting: Internal Medicine

## 2013-11-29 ENCOUNTER — Ambulatory Visit (INDEPENDENT_AMBULATORY_CARE_PROVIDER_SITE_OTHER): Payer: BC Managed Care – PPO | Admitting: Family Medicine

## 2013-11-29 VITALS — BP 110/72 | HR 75 | Temp 97.5°F | Resp 18 | Ht 67.0 in | Wt 176.0 lb

## 2013-11-29 DIAGNOSIS — R5381 Other malaise: Secondary | ICD-10-CM

## 2013-11-29 DIAGNOSIS — R5383 Other fatigue: Secondary | ICD-10-CM

## 2013-11-29 DIAGNOSIS — J029 Acute pharyngitis, unspecified: Secondary | ICD-10-CM

## 2013-11-29 LAB — POCT CBC
Granulocyte percent: 62.7 %G (ref 37–80)
HCT, POC: 47.9 % (ref 43.5–53.7)
Hemoglobin: 15.2 g/dL (ref 14.1–18.1)
Lymph, poc: 1.8 (ref 0.6–3.4)
MCH, POC: 27.6 pg (ref 27–31.2)
MCHC: 31.8 g/dL (ref 31.8–35.4)
MCV: 86.6 fL (ref 80–97)
MID (cbc): 0.1 (ref 0–0.9)
MPV: 7.4 fL (ref 0–99.8)
POC Granulocyte: 3.3 (ref 2–6.9)
POC LYMPH PERCENT: 34.8 %L (ref 10–50)
POC MID %: 2.5 %M (ref 0–12)
Platelet Count, POC: 183 10*3/uL (ref 142–424)
RBC: 5.53 M/uL (ref 4.69–6.13)
RDW, POC: 14.3 %
WBC: 5.2 10*3/uL (ref 4.6–10.2)

## 2013-11-29 LAB — POCT URINALYSIS DIPSTICK
Bilirubin, UA: NEGATIVE
Blood, UA: NEGATIVE
Glucose, UA: NEGATIVE
Ketones, UA: NEGATIVE
Leukocytes, UA: NEGATIVE
Nitrite, UA: NEGATIVE
Protein, UA: NEGATIVE
Spec Grav, UA: 1.01
Urobilinogen, UA: 0.2
pH, UA: 6

## 2013-11-29 LAB — COMPREHENSIVE METABOLIC PANEL
ALT: 31 U/L (ref 0–53)
AST: 25 U/L (ref 0–37)
Albumin: 4.4 g/dL (ref 3.5–5.2)
Alkaline Phosphatase: 58 U/L (ref 39–117)
BUN: 13 mg/dL (ref 6–23)
CO2: 30 mEq/L (ref 19–32)
Calcium: 9.2 mg/dL (ref 8.4–10.5)
Chloride: 104 mEq/L (ref 96–112)
Creat: 1.1 mg/dL (ref 0.50–1.35)
Glucose, Bld: 92 mg/dL (ref 70–99)
Potassium: 4.2 mEq/L (ref 3.5–5.3)
Sodium: 141 mEq/L (ref 135–145)
Total Bilirubin: 0.7 mg/dL (ref 0.2–1.2)
Total Protein: 7.1 g/dL (ref 6.0–8.3)

## 2013-11-29 LAB — POCT UA - MICROSCOPIC ONLY
Casts, Ur, LPF, POC: NEGATIVE
Crystals, Ur, HPF, POC: NEGATIVE
Mucus, UA: NEGATIVE
Yeast, UA: NEGATIVE

## 2013-11-29 NOTE — Progress Notes (Addendum)
Subjective:  This chart was scribed for Elvina Sidle, MD by Carl Best, Medical Scribe. This patient was seen in Room 5 and the patient's care was started at 12:52 PM.    Patient ID: Parker Curry, male    DOB: November 21, 1975, 38 y.o.   MRN: [id number]  HPI HPI Comments: Parker Curry is a 38 y.o. male who [facility] the [facility] complaining of constant dizziness and headache that started 10 days ago.  He has taken Dayquil and Nyquil with no relief to his symptoms.  He denies fever, abdominal pain, vomiting, urinary symptoms, rash, and cough as associated symptoms.  He endorses diaphoresis and sore throat as associated symptoms.  He still takes Tenofovir.  He has a history of tonsillectomy.  He works in the [employer].    Past Medical History  Diagnosis Date  . Chronic hepatitis B    Past Surgical History  Procedure Laterality Date  . Liver biopsy    . Tonsillectomy and adenoidectomy    . Upper gastrointestinal endoscopy  06/17/2011    and Maloney dilation (no stricture)   Family History  Problem Relation Age of Onset  . Colon cancer Neg Hx    History   Social History  . Marital Status: Single    Spouse Name: N/A    Number of Children: 0  . Years of Education: N/A   Occupational History  . [employer]    Social History Main Topics  . Smoking status: Never Smoker   . Smokeless tobacco: Never Used  . Alcohol Use: No  . Drug Use: No  . Sexual Activity: Not on file   Other Topics Concern  . 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   His friends and supporter who is a Consulting civil engineer has a clinical lab signed just at [facility].   No Known Allergies  Review of Systems  Constitutional: Positive for diaphoresis. Negative for fever.  HENT: Positive for sore throat.   Respiratory: Negative for cough.   Gastrointestinal: Negative for vomiting and abdominal pain.  Endocrine: Negative for  polyuria.  Genitourinary: Negative for dysuria, frequency, hematuria, enuresis and difficulty urinating.  Musculoskeletal: Positive for myalgias.  Skin: Negative for rash.  Neurological: Positive for dizziness and headaches.     Objective:  Physical Exam  Nursing note and vitals reviewed. Constitutional: He is oriented to person, place, and time. He appears well-developed and well-nourished.  HENT:  Head: Normocephalic and atraumatic.  Right Ear: External ear normal.  Left Ear: External ear normal.  Nose: Nose normal.  Mouth/Throat: Oropharynx is clear and moist. No oropharyngeal exudate.  Tonsils are absent.   Eyes: Conjunctivae and EOM are normal. Pupils are equal, round, and reactive to light.  Neck: Normal range of motion. Neck supple. No thyromegaly present.  Cardiovascular: Normal rate, regular rhythm and normal heart sounds.  Exam reveals no gallop and no friction rub.   No murmur heard. Pulmonary/Chest: Effort normal and breath sounds normal. No respiratory distress. He has no wheezes. He has no rales.  Abdominal: Soft. Bowel sounds are normal. There is no tenderness. There is no CVA tenderness.  Musculoskeletal: Normal range of motion.  Lymphadenopathy:    He has no cervical adenopathy.  Neurological: He is alert and oriented to person, place, and time.  Skin: Skin is warm and dry. No rash noted.  Psychiatric: He has a normal mood and affect.  His behavior is normal.   Results for orders placed in visit on 11/29/13  POCT CBC      Result Value Ref Range   WBC 5.2  4.6 - 10.2 K/uL   Lymph, poc 1.8  0.6 - 3.4   POC LYMPH PERCENT 34.8  10 - 50 %L   MID (cbc) 0.1  0 - 0.9   POC MID % 2.5  0 - 12 %M   POC Granulocyte 3.3  2 - 6.9   Granulocyte percent 62.7  37 - 80 %G   RBC 5.53  4.69 - 6.13 M/uL   Hemoglobin 15.2  14.1 - 18.1 g/dL   HCT, POC 16.1  09.6 - 53.7 %   MCV 86.6  80 - 97 fL   MCH, POC 27.6  27 - 31.2 pg   MCHC 31.8  31.8 - 35.4 g/dL   RDW, POC 04.5      Platelet Count, POC 183  142 - 424 K/uL   MPV 7.4  0 - 99.8 fL  POCT URINALYSIS DIPSTICK      Result Value Ref Range   Color, UA yellow     Clarity, UA clear     Glucose, UA neg     Bilirubin, UA neg     Ketones, UA neg     Spec Grav, UA 1.010     Blood, UA neg     pH, UA 6.0     Protein, UA neg     Urobilinogen, UA 0.2     Nitrite, UA neg     Leukocytes, UA Negative    POCT UA - MICROSCOPIC ONLY      Result Value Ref Range   WBC, Ur, HPF, POC 0-1     RBC, urine, microscopic 0-1     Bacteria, U Microscopic trace     Mucus, UA neg     Epithelial cells, urine per micros 0-2     Crystals, Ur, HPF, POC neg     Casts, Ur, LPF, POC neg     Yeast, UA neg        BP 110/72  Pulse 75  Temp(Src) 97.5 F (36.4 C) (Oral)  Resp 18  Ht  (1.702 m)  Wt 176 lb (79.833 kg)  BMI 27.56 kg/m2  SpO2 97% Assessment & Plan:  I personally performed the services described in this documentation, which was scribed in my presence. The recorded information has been reviewed and is accurate.  Symptoms, signs, and lab values all point to a viral infection  Acute pharyngitis, unspecified pharyngitis type - Plan: POCT CBC, POCT urinalysis dipstick, POCT UA - Microscopic Only, Comprehensive metabolic panel  Malaise - Plan: POCT CBC, POCT urinalysis dipstick, POCT UA - Microscopic Only, Comprehensive metabolic panel  Patient will be traveling to [facility] tomorrow.  Advised that CMET is pending and we will contact him with results on his cell.  Take advil in meantime.  Signed, Elvina Sidle, MD

## 2014-03-21 ENCOUNTER — Other Ambulatory Visit: Payer: Self-pay | Admitting: Internal Medicine

## 2014-03-21 DIAGNOSIS — K2100 Gastro-esophageal reflux disease with esophagitis, without bleeding: Secondary | ICD-10-CM

## 2014-03-23 ENCOUNTER — Ambulatory Visit
Admission: RE | Admit: 2014-03-23 | Discharge: 2014-03-23 | Disposition: A | Discharge status: Home / Self Care | Payer: Self-pay | Source: Ambulatory Visit | Attending: Internal Medicine | Admitting: Internal Medicine

## 2014-03-23 DIAGNOSIS — K2100 Gastro-esophageal reflux disease with esophagitis, without bleeding: Secondary | ICD-10-CM

## 2014-07-20 IMAGING — US US ABDOMEN COMPLETE
1 series · 13 of 25 positions shown · non-contrast
Comparison: May 09, 2011

CLINICAL DATA: Chronic hepatitis-B

EXAM:
ULTRASOUND ABDOMEN COMPLETE

[Series 1: us abdomen complete · 0.22mm/px · 13 of 76 slices shown]
[im 1/76]
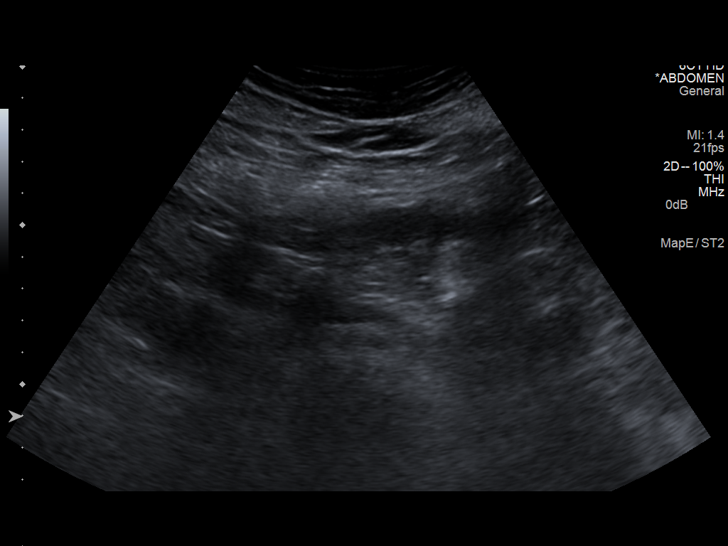
[im 7/76]
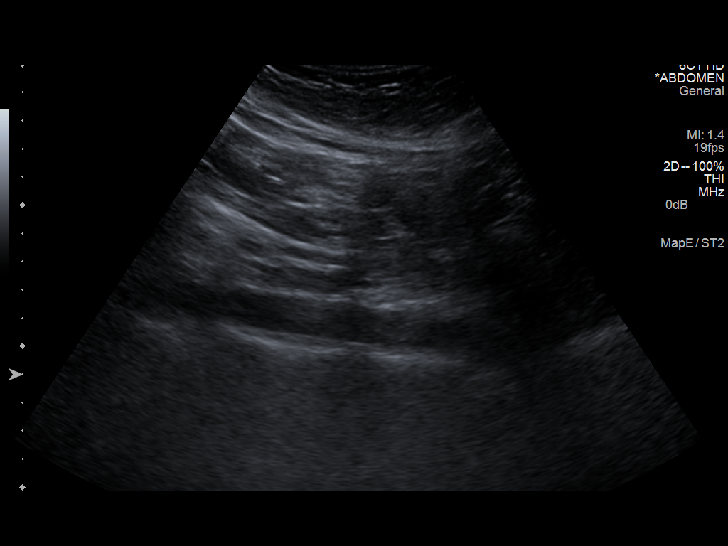
[im 13/76]
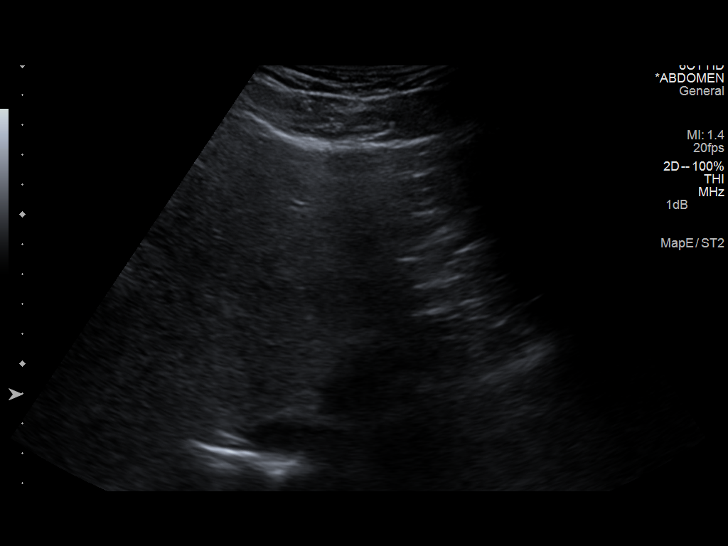
[im 19/76]
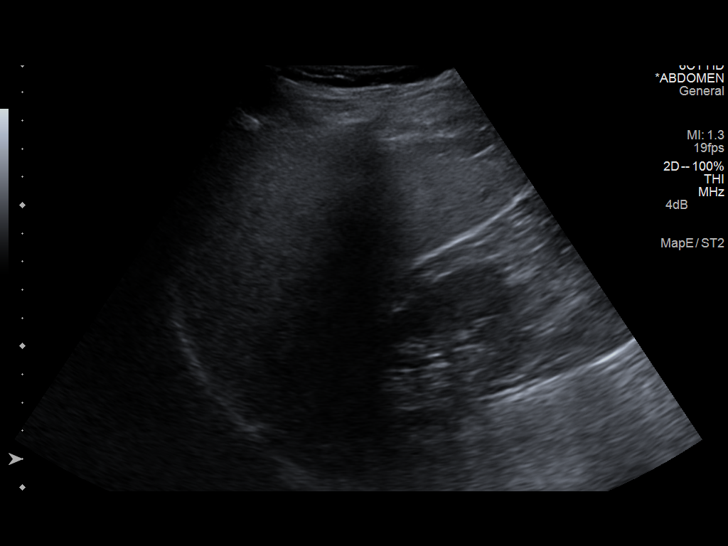
[im 26/76]
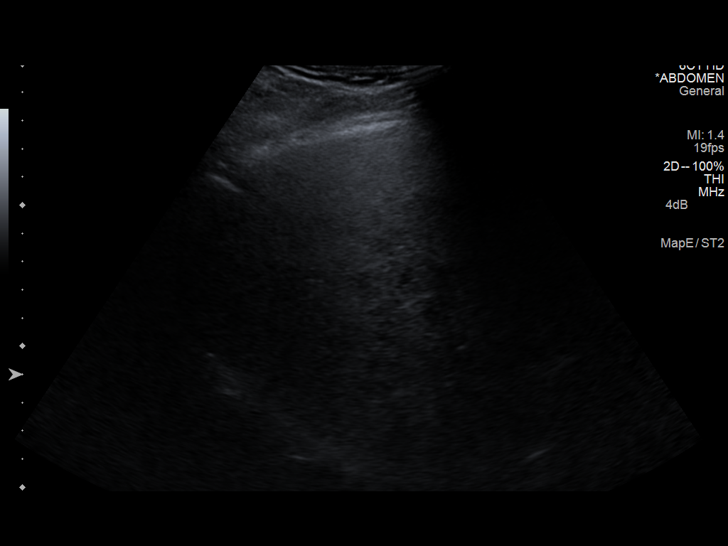
[im 32/76]
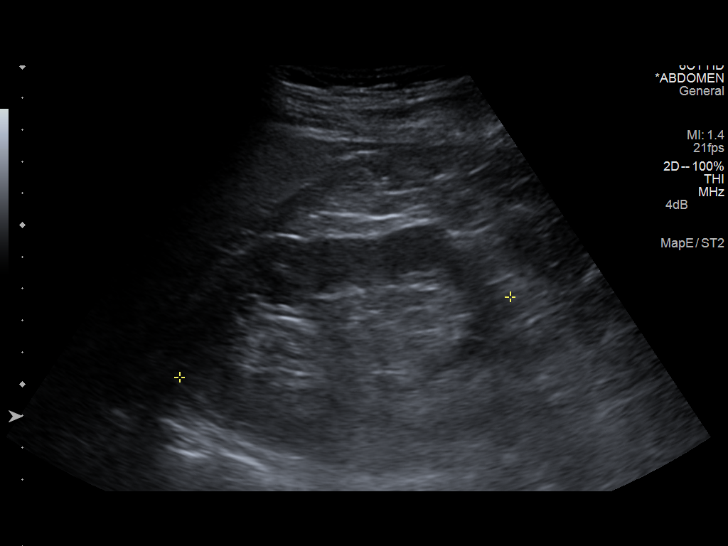
[im 38/76]
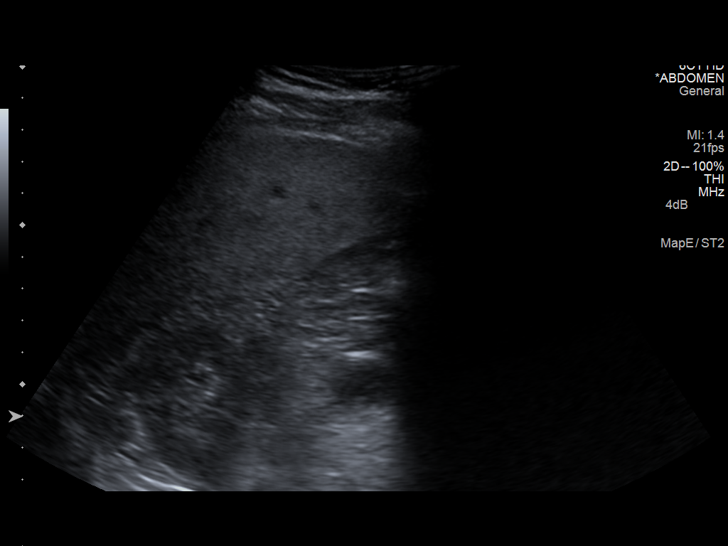
[im 44/76]
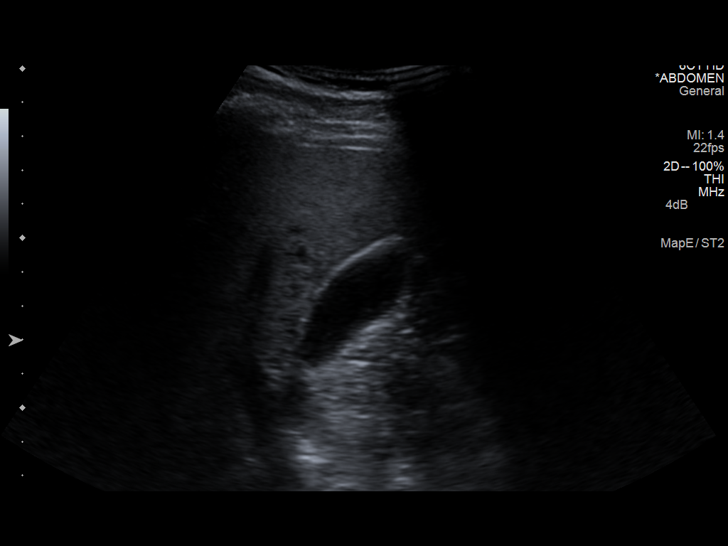
[im 51/76]
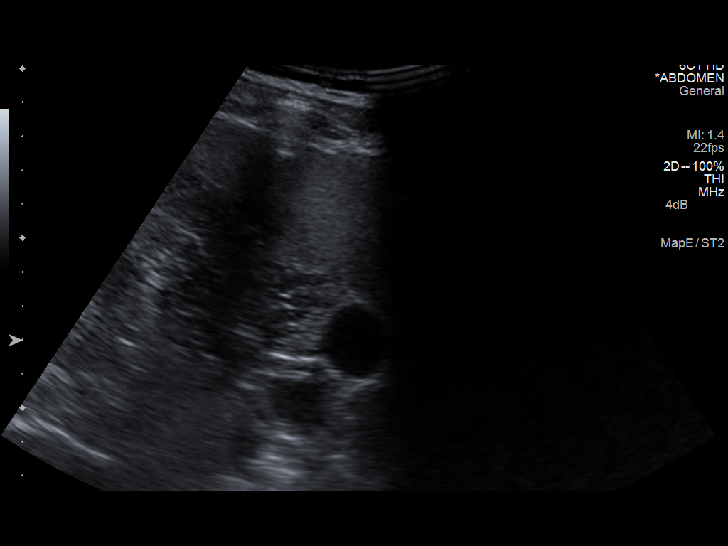
[im 57/76]
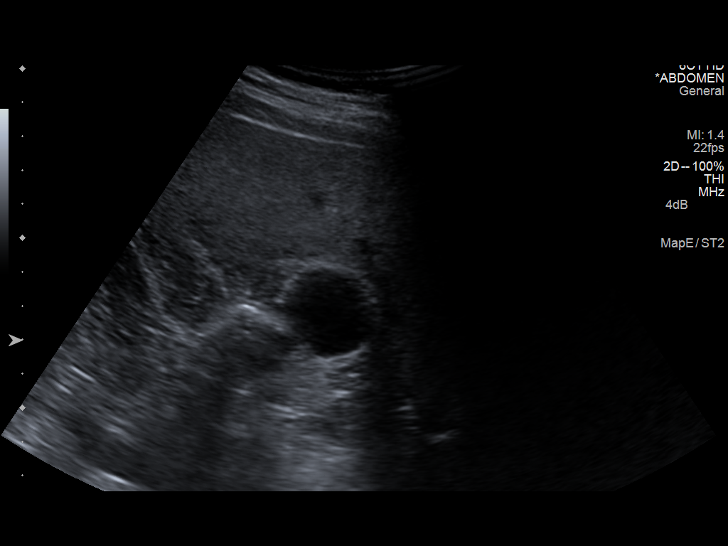
[im 63/76]
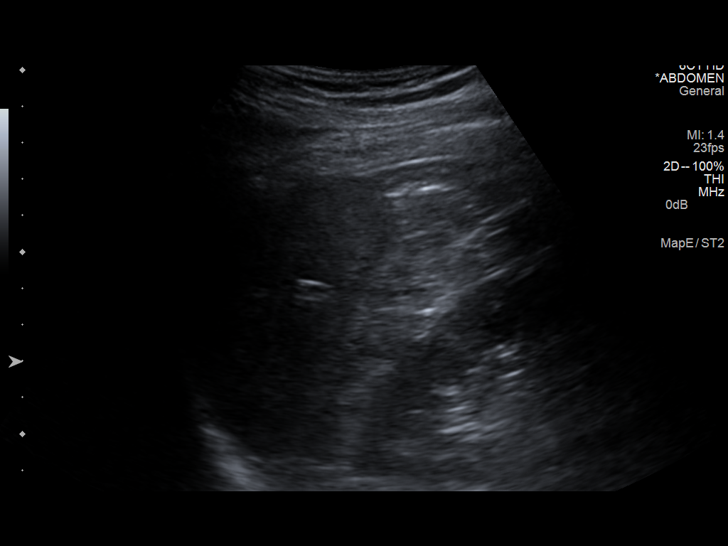
[im 69/76]
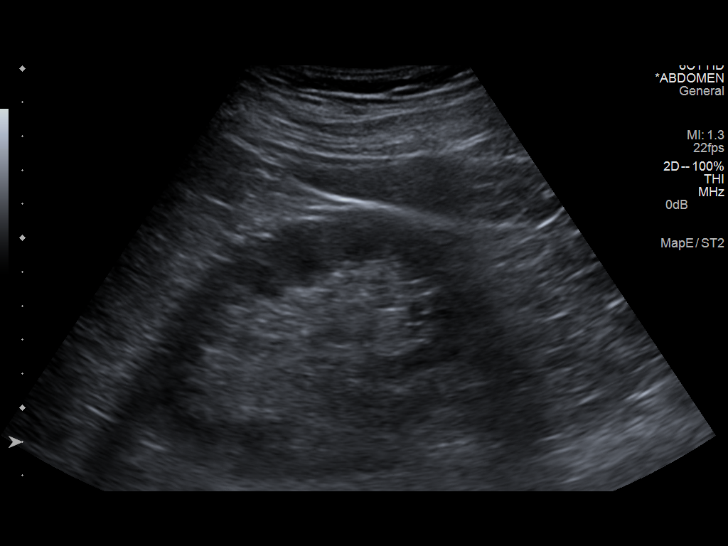
[im 76/76]
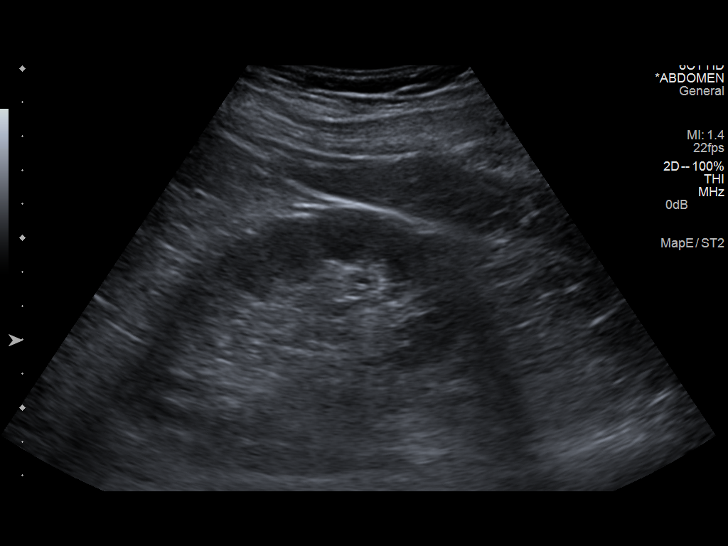

[13 of 25 positions shown; findings below may reference images not displayed]

FINDINGS: Gallbladder:

No gallstones or wall thickening visualized. There is no
pericholecystic fluid. No sonographic Murphy sign noted.

Common bile duct:

Diameter: 4 mm. There is no intrahepatic, common hepatic, or common
bile duct dilatation.

Liver:

No focal lesion identified. The liver echogenicity is diffusely
increased.

IVC:

No abnormality visualized.

Pancreas:

Visualized portion unremarkable. Much of pancreas is obscured by
gas.

Spleen:

Size and appearance within normal limits.

Right Kidney:

Length: 10.7 cm. Echogenicity within normal limits. No mass or
hydronephrosis visualized.

Left Kidney:

Length: 11.1 cm. Echogenicity within normal limits. No mass or
hydronephrosis visualized.

Abdominal aorta:

No aneurysm visualized.

Other findings:

No demonstrable ascites.
IMPRESSION: Liver again shows diffuse increased echogenicity, probably due to a
combination of fatty change in underlying parenchymal disease. While
no focal liver lesions are identified, it must be noted that the
sensitivity of ultrasound for focal liver lesions is diminished
significantly in this circumstance.

Most of pancreas is obscured by gas. The portions of the pancreas
which are visualized appear normal.

Study otherwise unremarkable.

## 2015-07-12 ENCOUNTER — Ambulatory Visit (INDEPENDENT_AMBULATORY_CARE_PROVIDER_SITE_OTHER): Payer: BLUE CROSS/BLUE SHIELD | Admitting: Physician Assistant

## 2015-07-12 VITALS — BP 122/72 | HR 62 | Temp 97.9°F | Resp 17 | Ht 66.5 in | Wt 172.0 lb

## 2015-07-12 DIAGNOSIS — S50862A Insect bite (nonvenomous) of left forearm, initial encounter: Secondary | ICD-10-CM

## 2015-07-12 DIAGNOSIS — S80862A Insect bite (nonvenomous), left lower leg, initial encounter: Secondary | ICD-10-CM | POA: Diagnosis not present

## 2015-07-12 DIAGNOSIS — L282 Other prurigo: Secondary | ICD-10-CM

## 2015-07-12 DIAGNOSIS — R21 Rash and other nonspecific skin eruption: Secondary | ICD-10-CM | POA: Diagnosis not present

## 2015-07-12 DIAGNOSIS — W57XXXA Bitten or stung by nonvenomous insect and other nonvenomous arthropods, initial encounter: Secondary | ICD-10-CM | POA: Diagnosis not present

## 2015-07-12 MED ORDER — TRIAMCINOLONE ACETONIDE 0.5 % EX OINT: 1.0000 "application " | TOPICAL_OINTMENT | Freq: Two times a day (BID) | CUTANEOUS | Status: AC

## 2015-07-12 MED ORDER — HYDROXYZINE HCL 25 MG PO TABS: 12.5000 mg | ORAL_TABLET | Freq: Three times a day (TID) | ORAL | Status: AC | PRN

## 2015-07-12 MED ORDER — CETIRIZINE HCL 10 MG PO TABS: 10.0000 mg | ORAL_TABLET | Freq: Every day | ORAL | Status: AC

## 2015-07-12 NOTE — Patient Instructions (Addendum)
   IF you received an x-ray today, you will receive an invoice from [facility]. Please contact [facility] at [phone] with questions or concerns regarding your invoice.   IF you received labwork today, you will receive an invoice from [facility]. Please contact [facility] at [phone] with questions or concerns regarding your invoice.   Our billing staff will not be able to assist you with questions regarding bills from these companies.  You will be contacted with the lab results as soon as they are available. The fastest way to get your results is to activate your My Chart account. Instructions are located on the last page of this paperwork. If you have not heard from us regarding the results in 2 weeks, please contact this office.    Please apply the ointment to these areas.  Wash the area with regular soap and water twice per day and reapply.  I would like you to return if your symptoms do not improve in 1 week.  Do not apply the ointment after 2 weeks   The vistaril will help with itching.  This is sedating so be careful You can take the zyrtec once per day to help with your allergy symptoms.

## 2015-07-13 NOTE — Progress Notes (Signed)
[facility] [address] [place] [address] [phone]  Date:  07/12/2015   Name:  Parker Curry   DOB:  01/17/1976   MRN:  [id number]  PCP:  No primary care provider on file.    History of Present Illness:  Sharif Rendell is a 40 y.o. male patient who presents to [facility] for cc of rash on left arm and left leg.  Patient reports he developed a pruritic bump on his left lower leg.  This progressively worsened to several bumps along the lower leg and then started on his left arm.  There is no pain or drainage.  He was in a community garden working.  He thought it may have been poison ivy.  He denies any bites.  He has attempted calamine lotion which helps very temporary.   Patient Active Problem List   Diagnosis Date Noted  . Chest pressure 02/25/2012  . Chronic hepatitis B (HCC) 05/03/2011  . GERD (gastroesophageal reflux disease) 05/03/2011  . Infective tonsillitis 04/11/2011  . 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 Chronic hepatitis B [facility]     Past Surgical History  Procedure Laterality Date  . Liver biopsy    . Tonsillectomy and adenoidectomy    . Upper gastrointestinal endoscopy  06/17/2011    and Maloney dilation (no stricture)    Social History  Substance Use Topics  . Smoking status: Never Smoker   . Smokeless tobacco: Never Used  . Alcohol Use: No    Family History  Problem Relation Age of Onset  . Colon cancer Neg Hx     No Known Allergies  Medication list has been reviewed and updated.  No current outpatient prescriptions on file prior to visit.   Current Facility-Administered Medications on File Prior to Visit  Medication Dose Route Frequency Provider Last Rate Last Dose  . Tdap (BOOSTRIX) injection 0.5 mL  0.5 mL Intramuscular Once Ascencion Dike, PA-C        ROS ROS otherwise unremarkable unless listed above.  Physical Examination: BP 122/72 mmHg  Pulse 62  Temp(Src) 97.9 F (36.6 C)  (Oral)  Resp 17  Ht 5' 6.5" (1.689 m)  Wt 172 lb (78.019 kg)  BMI 27.35 kg/m2  SpO2 97% Ideal Body Weight: Weight in (lb) to have BMI = 25: 156.9  Physical Exam  Constitutional: He is oriented to person, place, and time. He appears well-developed and well-nourished. No distress.  HENT:  Head: Normocephalic and atraumatic.  Eyes: Conjunctivae and EOM are normal. Pupils are equal, round, and reactive to light.  Cardiovascular: Normal rate.   Pulmonary/Chest: Effort normal. No respiratory distress. He has no decreased breath sounds. He has no wheezes. He has no rhonchi.  Neurological: He is alert and oriented to person, place, and time.  Skin: Skin is warm and dry. He is not diaphoretic.  Erythematous patches along her forearm, and her lower leg, with small vesicles.  This is non-tender.  No exudate or drainage.  No warmth.   Psychiatric: He has a normal mood and affect. His behavior is normal.       Assessment and Plan: Kevin Space is a 40 y.o. male who is here today for rash.  i have advised steroid ointment.  Given anti-histamine lowering medications, to decrease inflammation and pruritic symptoms.    Insect bite - Plan: triamcinolone ointment (KENALOG) 0.5 %  Rash and nonspecific skin eruption - Plan: triamcinolone ointment (KENALOG) 0.5 %,  hydrOXYzine (ATARAX/VISTARIL) 25 MG tablet, cetirizine (ZYRTEC) 10 MG tablet  Pruritic rash - Plan: hydrOXYzine (ATARAX/VISTARIL) 25 MG tablet, cetirizine (ZYRTEC) 10 MG tablet  Trena PlattStephanie English, PA-C [facility] 07/13/2015 3:45 PM

## 2015-08-07 ENCOUNTER — Other Ambulatory Visit: Payer: Self-pay | Admitting: Physician Assistant

## 2016-04-17 ENCOUNTER — Ambulatory Visit (INDEPENDENT_AMBULATORY_CARE_PROVIDER_SITE_OTHER): Payer: BLUE CROSS/BLUE SHIELD | Admitting: Family Medicine

## 2016-04-17 VITALS — BP 124/80 | HR 93 | Temp 98.4°F | Resp 17 | Ht 67.0 in | Wt 176.0 lb

## 2016-04-17 DIAGNOSIS — J029 Acute pharyngitis, unspecified: Secondary | ICD-10-CM

## 2016-04-17 LAB — POCT RAPID STREP A (OFFICE): Rapid Strep A Screen: NEGATIVE

## 2016-04-17 MED ORDER — PHENOL 1.4 % MT LIQD: 1.0000 | OROMUCOSAL | 0 refills | Status: AC | PRN

## 2016-04-17 MED ORDER — SALINE SPRAY 0.65 % NA SOLN: 1.0000 | NASAL | 0 refills | Status: AC | PRN

## 2016-04-17 NOTE — Progress Notes (Signed)
   Parker Curry is a 41 y.o. male who presents to [facility] today for  Chief Complaint  Patient presents with  . Nasal Congestion  . Sore Throat  . Headache  . Fatigue    1.  URI: Patient is concerned about having the flu today. Patient states that for the past week he has had a sore throat, runny nose, fatigue, headache, decreased energy, and muscle aches. He notes that his brother who lives at home with him is also sick with similar symptoms. He has tried DayQuil and NyQuil without relief. He denies any fever at this time. Admits to occasional nonproductive cough. Denies any shortness of breath or wheezing. Denies any nausea vomiting, diarrhea, abdominal pain.  ROS as above.  Marland Kitchen.   PMH reviewed. Patient is a nonsmoker.   Past Medical History:  Diagnosis Date  . Chronic hepatitis B (HCC)    Past Surgical History:  Procedure Laterality Date  . LIVER BIOPSY    . TONSILLECTOMY AND ADENOIDECTOMY    . UPPER GASTROINTESTINAL ENDOSCOPY  06/17/2011   and Elease HashimotoMaloney dilation (no stricture)    Medications reviewed. Current Outpatient Prescriptions  Medication Sig Dispense Refill  . cetirizine (ZYRTEC) 10 MG tablet Take 1 tablet (10 mg total) by mouth daily. 30 tablet 3  . hydrOXYzine (ATARAX/VISTARIL) 25 MG tablet Take 0.5-1 tablets (12.5-25 mg total) by mouth every 8 (eight) hours as needed for itching. 30 tablet 0  . triamcinolone ointment (KENALOG) 0.5 % Apply 1 application topically 2 (two) times daily. (Patient not taking: Reported on 04/17/2016) 30 g 0   Current Facility-Administered Medications  Medication Dose Route Frequency Provider Last Rate Last Dose  . Tdap (BOOSTRIX) injection 0.5 mL  0.5 mL Intramuscular Once Ascencion DikeNancy K Hartman, PA-C         Physical Exam:  BP 124/80 (BP Location: Right Arm, Patient Position: Sitting, Cuff Size: Normal)   Pulse 93   Temp 98.4 F (36.9 C) (Oral)   Resp 17   Ht 5\' 7"  (1.702 m)   Wt 176 lb (79.8 kg)   SpO2 99%   BMI 27.57 kg/m    Gen:  Alert, cooperative patient who appears stated age in no acute distress.  Vital signs reviewed. HEENT: EOMI,  MMM, nontender sinuses, tympanic membrane normal bilaterally, pharyngeal erythema but no exudates, and no lymphadenopathy Pulm:  Clear to auscultation bilaterally with good air movement.  No wheezes or rales noted.   Cardiac:  Regular rate and rhythm without murmur auscultated.  Good S1/S2. Abd:  Soft/nondistended/nontender.  Good bowel sounds throughout all four quadrants.  No masses noted.  Exts: Non edematous BL  Kluender, warm and well perfused.   Results for orders placed or performed in visit on 04/17/16  POCT rapid strep A  Result Value Ref Range   Rapid Strep A Screen Negative Negative   Assessment and Plan:  1.  URI: Possible flu virus versus other viral upper respiratory infection. Patient's symptoms have been ongoing for about a week, he is outside the window to treat for influenza. Rapid Strep test negative. No signs of pneumonia or bronchitis. Normal lung exam. -Symptomatic therapy; plenty of rest, oral rehydration, warm tea with honey, Tylenol when necessary -Return precautions discussed  Anders Simmondshristina Taha Dimond, MD [facility], PGY-2

## 2016-04-17 NOTE — Patient Instructions (Addendum)
Nhi?m trng ???ng h h?p trn, Ng??i l?n (Upper Respiratory Infection, Adult) H?u h?t nhi?m trng ???ng h h?p trn (URI) l b?nh nhi?m do vi rt ? ???ng d?n kh ??n ph?i. URI ?nh h??ng ??n m?i, h?ng v ???ng d?n kh trn. Lo?i URI ph? bi?n nh?t l vim m?i h?ng v th??ng ???c cho l "c?m l?nh thng th??ng". URI ti?n tri?n v th??ng s? t? kh?i. Thng th??ng URI khng c?n ?i?u tr?, nh?ng ?i khi ti?p theo nhi?m vi rt l nhi?m khu?n ???ng h h?p trn. Tnh tr?ng ny g?i l nhi?m trng th? pht. Nhi?m trng xoang v tai gi?a l nh?ng lo?i nhi?m trng h h?p trn th? pht ph? bi?n nh?t. Vim ph?i do vi khu?n c?ng c th? lm cho URI tr?m tr?ng h?n. URI c th? lm cho b?nh hen suy?n v b?nh ph?i t?c ngh?n m?n tnh (COPD) tr?m tr?ng h?n. ?i khi, nh?ng bi?n ch?ng ny c th? c?n ph?i ???c ?i?u tr? c?p c?u n?i khoa v c th? ?e d?a tnh m?ng. NGUYN NHN H?u h?t t?t c? cc tr??ng h?p URI ??u do vi rt gy ra. Vi rt l m?t lo?i m?m b?nh v c th? ly lan t? ng??i ny sang ng??i khc. CC Y?U T? NGUY C? Qu v? c th? c nguy c? b? URI n?u:  Qu v? ht thu?c l.  Qu v? b? b?nh tim ho?c b?nh ph?i m?n tnh.  Qu v? c h? th?ng phng v? (mi?n d?ch) suy y?u.  Qu v? r?t tr? ho?c r?t gi.  Qu v? b? d? ?ng ? m?i ho?c b? hen suy?n.  Qu v? lm vi?c trong cc khu v?c ?ng ?c ho?c thng kh km.  Qu v? lm vi?c trong cc trung tm ch?m sc s?c kh?e ho?c tr??ng h?c. D?U HI?U V TRI?U CH?NG Cc tri?u ch?ng th??ng pht tri?n sau khi qu v? ti?p xc v?i vi rt c?m l?nh 2-3 ngy. H?u h?t cc tr??ng h?p URI do vi rt ko di 7-10 ngy. Tuy nhin, cc tr??ng h?p URI do vi rt cm (vi rt cm) c th? ko di 14 - 18 ngy v th??ng n?ng h?n. Tri?u ch?ng c th? bao g?m:  Ch?y n??c m?i ho?c ng?t (ngh?t) m?i.  H?t h?i.  Ho.  ?au h?ng.  ?au ??u.  M?t m?i.  S?t.  ?n khng ngon mi?ng.  ?au ? trn, pha sau m?t v trn x??ng g m (?au xoang).  ?au nh?c c?. CH?N ?ON Chuyn gia ch?m sc s?c kh?e c  th? ch?n ?on URI b?ng cch:  Khm th?c th?.  Ki?m tra ?? kh?ng ??nh cc tri?u ch?ng khng ph?i l do m?t tnh tr?ng khc, ch?ng h?n nh?:  Vim h?ng do lin c?u khu?n.  Vim xoang.  Vim ph?i.  Hen suy?n. ?I?U TR? URI t? kh?i sau m?t th?i gian. B?nh khng th? ch?a kh?i ???c b?ng thu?c, nh?ng thu?c c th? ???c k ??n v khuy?n ngh? dng ?? lm gi?m cc tri?u ch?ng. Thu?c c th? gip:  H? s?t.  Gi?m ho.  Gi?m ngh?t m?i. H??NG D?N CH?M SC T?I NH  Ch? s? d?ng thu?c theo ch? d?n c?a chuyn gia ch?m sc s?c kh?e.  Xc mi?ng b?ng n??c mu?i ?m ho?c dng thu?c gi?m ho ?? lm d?u h?ng qu v? theo ch? d?n c?a chuyn gia ch?m sc s?c kh?e.  S? d?ng my t?o ?? ?m b?ng s??ng m ?m ho?c ht h?i n??c t? m?t vi sen ?? t?ng ?? ?m khng kh. ?i?u   ny c th? gip d? th? h?n.  U?ng ?? n??c ?? gi? cho n??c ti?u trong ho?c c mu vng nh?t.  ?n sp ho?c cc lo?i n??c canh trong khc v duy tr ch? ?? dinh d??ng t?t.  Ngh? ng?i khi c?n.  Tr? l?i lm vi?c khi nhi?t ?? c?a qu v? ? tr? l?i bnh th??ng ho?c theo chuyn gia ch?m sc s?c kh?e h??ng d?n. Qu v? c th? c?n ? nh lu h?n ?? trnh ly nhi?m cho ng??i khc. Qu v? c?ng c th? s? d?ng m?t n? v r?a tay c?n th?n ?? ng?n ng?a ly lan vi rt.  T?ng c??ng s? d?ng ?ng thu?c ht n?u qu v? b? b?nh hen suy?n.  Khng s? d?ng b?t c? cc s?n ph?m thu?c l no, bao g?m thu?c l d?ng ht, thu?c l d?ng nhai ho?c thu?c l ?i?n t?. N?u qu v? c?n gip ?? ?? cai thu?c, hy h?i chuyn gia ch?m sc s?c kh?e. PHNG NG?A Cch t?t nh?t ?? b?o v? b?n thn kh?i b? c?m l?nh l th?c hnh v? sinh t?t.  Trnh ti?p xc b?ng mi?ng ho?c b?ng tay v?i nh?ng ng??i c tri?u ch?ng c?m l?nh.  R?a tay th??ng xuyn n?u c ti?p xc. Khng c b?ng ch?ng r rng v? vi?c vitamin C, vitamin E, echinacea ho?c t?p th? d?c lm gi?m kh? n?ng b? c?m l?nh. Tuy nhin, qu v? lun c?n ngh? ng?i nhi?u, t?p th? d?c v c ch? ?? dinh d??ng t?t. ?I KHM N?U:  Qu v? b? tr?m tr?ng h?n  ch? khng ?? h?n.  Tri?u ch?ng c?a qu v? khng ki?m sot ???c b?ng thu?c.  Qu v? b? ?n l?nh.  Qu v? b? kh th? h?n.  Qu v? c ??m mu nu ho?c ??.  Qu v? ti?t d?ch mu vng ho?c mu nu ? m?i.  Qu v? b? ?au ? m?t, ??c bi?t l khi qu v? ci v? pha tr??c.  Qu v? b? s?t.  Qu v? b? s?ng h?ch c?.  Qu v? th?y ?au khi nu?t.  Qu v? c nh?ng vng mu tr?ng ? thnh sau h?ng. NGAY L?P T?C ?I KHM N?U:  Qu v? b? n?ng v lin t?c:  ?au ??u.  ?au tai.  ?au xoang.  ?au ng?c.  Qu v? b? b?nh ph?i m?n tnh v b?t k? tnh tr?ng no sau ?y:  Th? kh kh.  Ho ko di.  Ho ra mu.  Thay ??i s? l??ng v mu s?c ??m thng th??ng c?a qu v?.  Qu v? b? c?ng c?.  Qu v? c nh?ng thay ??i v?:  Th? l?c.  Thnh l?c.  Suy ngh?.  Tm tnh. ??M B?O QU V?:  Hi?u r cc h??ng d?n ny.  S? theo di tnh tr?ng c?a mnh.  S? yu c?u tr? gip ngay l?p t?c n?u qu v? c?m th?y khng kh?e ho?c th?y tr?m tr?ng h?n. Thng tin ny khng nh?m m?c ?ch thay th? cho l?i khuyn m chuyn gia ch?m sc s?c kh?e ni v?i qu v?. Hy b?o ??m qu v? ph?i th?o lu?n b?t k? v?n ?? g m qu v? c v?i chuyn gia ch?m sc s?c kh?e c?a qu v?. Document Released: 09/24/2010 Document Revised: 07/26/2014 Document Reviewed: 06/16/2013 Elsevier Interactive Patient Education  2017 [employer]     IF you received an x-ray today, you will receive an invoice from [facility]. Please contact [facility] at [phone] with questions or concerns regarding your invoice.   IF   you received labwork today, you will receive an invoice from [facility]. Please contact [facility] at [phone] with questions or concerns regarding your invoice.   Our billing staff will not be able to assist you with questions regarding bills from these companies.  You will be contacted with the lab results as soon as they are available. The fastest way to get your results is to activate your My Chart  account. Instructions are located on the last page of this paperwork. If you have not heard from us regarding the results in 2 weeks, please contact this office.
